# Patient Record
Sex: Female | Born: 2000 | Race: White | Hispanic: No | Marital: Single | Smoking: Never smoker
Health system: Southern US, Community
[De-identification: ages and names within clinical notes are randomized; demographics above are authoritative.]

## PROBLEM LIST (undated history)

## (undated) DIAGNOSIS — N83209 Unspecified ovarian cyst, unspecified side: Secondary | ICD-10-CM

## (undated) DIAGNOSIS — Z23 Encounter for immunization: Secondary | ICD-10-CM

## (undated) DIAGNOSIS — IMO0001 Reserved for inherently not codable concepts without codable children: Secondary | ICD-10-CM

## (undated) DIAGNOSIS — N946 Dysmenorrhea, unspecified: Secondary | ICD-10-CM

## (undated) HISTORY — DX: Reserved for inherently not codable concepts without codable children: IMO0001

## (undated) HISTORY — PX: WISDOM TOOTH EXTRACTION: SHX21

## (undated) HISTORY — PX: NO PAST SURGERIES: SHX2092

## (undated) HISTORY — DX: Encounter for immunization: Z23

## (undated) HISTORY — DX: Dysmenorrhea, unspecified: N94.6

## (undated) HISTORY — DX: Unspecified ovarian cyst, unspecified side: N83.209

---
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
## 2018-03-07 ENCOUNTER — Telehealth: Payer: Self-pay

## 2018-03-07 NOTE — Telephone Encounter (Signed)
Spoke w/pt. Notified she can get filled at a [facility] [place] that she will be close to. [facility] in the [facility] and they can pull and fill at any location. Pt advised to find the pharmacy she will use and notify them ahead of time.

## 2018-03-07 NOTE — Telephone Encounter (Signed)
Pt states she is going out of town for the summer and will not be able to get her birth control. Inquiring if she can get it early. ZO#[phone]

## 2018-12-05 ENCOUNTER — Other Ambulatory Visit: Payer: Self-pay | Admitting: Obstetrics and Gynecology

## 2018-12-05 DIAGNOSIS — Z3041 Encounter for surveillance of contraceptive pills: Secondary | ICD-10-CM

## 2018-12-09 ENCOUNTER — Other Ambulatory Visit: Payer: Self-pay | Admitting: Obstetrics and Gynecology

## 2018-12-09 DIAGNOSIS — Z3041 Encounter for surveillance of contraceptive pills: Secondary | ICD-10-CM

## 2018-12-10 ENCOUNTER — Other Ambulatory Visit: Payer: Self-pay | Admitting: Obstetrics and Gynecology

## 2018-12-10 ENCOUNTER — Other Ambulatory Visit: Payer: Self-pay

## 2018-12-10 DIAGNOSIS — Z3041 Encounter for surveillance of contraceptive pills: Secondary | ICD-10-CM

## 2018-12-10 MED ORDER — NORGESTIMATE-ETH ESTRADIOL 0.25-35 MG-MCG PO TABS: 1.0000 | ORAL_TABLET | Freq: Every day | ORAL | 0 refills | Status: DC

## 2018-12-10 NOTE — Addendum Note (Signed)
Addended by: Loran Senters D on: 12/10/2018 03:10 PM   Modules accepted: Orders

## 2018-12-10 NOTE — Telephone Encounter (Signed)
Pt has moved to [place]. Needs bc [facility].  [phone]  [facility] [address].  Pharm changed in chart. 30m only supply sent as pt needs annual exam.

## 2018-12-10 NOTE — Addendum Note (Signed)
Addended by: Loran Senters D on: 12/10/2018 02:31 PM   Modules accepted: Orders

## 2019-04-21 ENCOUNTER — Other Ambulatory Visit: Payer: Self-pay | Admitting: Obstetrics and Gynecology

## 2019-04-21 DIAGNOSIS — Z3041 Encounter for surveillance of contraceptive pills: Secondary | ICD-10-CM

## 2019-05-02 ENCOUNTER — Telehealth: Payer: Self-pay

## 2019-05-02 NOTE — Telephone Encounter (Signed)
Called pt's mom, no answer, LVMTRC. 

## 2019-05-02 NOTE — Telephone Encounter (Signed)
Can make appt here or [facility] and pt has to make way to come in. Hasn't been seen since 1/19. Don't know what else to offer.

## 2019-05-02 NOTE — Telephone Encounter (Signed)
Spoke to patients mom, Debroah Baller. Says pt is in [place] about to start school, and its difficult to come back here. Mom suggested for daughter to go to health department, which she did, but was told they are not making appointments right now. Is there any way pt can get something in the meantime she gets to be seen at health dept?

## 2019-05-02 NOTE — Telephone Encounter (Signed)
Pt needs annual. 6 months past due. Will refill OCPs when she comes for annual since too late to restart them this cycle. Condoms.

## 2019-05-02 NOTE — Telephone Encounter (Signed)
Please advise 

## 2019-05-02 NOTE — Telephone Encounter (Signed)
Pt can sched with GYN in [place] if easier or go to student health at her school. Can't start anything till next menses anyway which gives her time to get appt.

## 2019-05-02 NOTE — Telephone Encounter (Signed)
Pt calling for refill of bc; has been out for a few weeks.  [phone]

## 2019-05-02 NOTE — Telephone Encounter (Signed)
Pts mom says [place] are not taking appointments either. Pt also has no insurance.

## 2019-05-03 NOTE — Telephone Encounter (Signed)
Pt's mom aware.

## 2020-04-12 ENCOUNTER — Inpatient Hospital Stay
Admit: 2020-04-12 | Discharge: 2020-04-12 | Disposition: A | Discharge status: Home / Self Care | Attending: Emergency Medicine

## 2020-04-12 DIAGNOSIS — N760 Acute vaginitis: Secondary | ICD-10-CM

## 2020-04-12 LAB — URINALYSIS W/MICROSCOPIC
Bacteria: NEGATIVE /hpf
Bilirubin: NEGATIVE
Glucose: NEGATIVE mg/dL
Ketone: NEGATIVE mg/dL
Nitrites: NEGATIVE
Protein: NEGATIVE mg/dL
Specific gravity: 1.023 (ref 1.003–1.030)
Urobilinogen: 0.2 EU/dL (ref 0.2–1.0)
pH (UA): 5 (ref 5.0–8.0)

## 2020-04-12 LAB — URINE CULTURE HOLD SAMPLE

## 2020-04-12 LAB — WET PREP
Wet Prep, Trich: NONE SEEN
Wet prep: NONE SEEN

## 2020-04-12 LAB — KOH, OTHER SOURCES

## 2020-04-12 LAB — URINALYSIS WITH MICROSCOPIC
BACTERIA, URINE: NEGATIVE /hpf
Bilirubin, Urine: NEGATIVE
Glucose, Ur: NEGATIVE mg/dL
Ketones, Urine: NEGATIVE mg/dL
Nitrite, Urine: NEGATIVE
Protein, UA: NEGATIVE mg/dL
Specific Gravity, UA: 1.023 (ref 1.003–1.030)
Urobilinogen, UA, POCT: 0.2 EU/dL (ref 0.2–1.0)
pH, UA: 5 (ref 5.0–8.0)

## 2020-04-12 MED ORDER — FLUCONAZOLE 150 MG TAB
150 mg | ORAL_TABLET | ORAL | 0 refills | Status: AC
Start: 2020-04-12 — End: 2020-04-12

## 2020-04-12 MED ORDER — METRONIDAZOLE 500 MG TAB
500 mg | ORAL_TABLET | Freq: Two times a day (BID) | ORAL | 0 refills | Status: AC
Start: 2020-04-12 — End: 2020-04-19

## 2020-04-12 NOTE — Progress Notes (Signed)
Needs treatment and to be informed. Left VM to callback.

## 2020-04-12 NOTE — ED Notes (Signed)
Pt discharged home with parent/guardian. Pt acting age appropriately, respirations regular and unlabored, cap refill less than two seconds. Skin pink, dry and warm. Lungs clear bilaterally. No further complaints at this time. Parent/guardian verbalized understanding of discharge paperwork and has no further questions at this time.    Education provided about continuation of care, follow up care and medication administration. Parent/guardian able to provide teach back about discharge instructions.

## 2020-04-12 NOTE — ED Notes (Signed)
Pt c/o white chunky vaginal discharge intermittently for 1 month. Pt c/o pain with wiping but no dysuria.

## 2020-04-12 NOTE — Progress Notes (Signed)
I spoke with patient, she is doing well. Discussed + chlamydia result and need for treatment, she can continue Flagyl as directed. Encouraged getting partner treated, avoid sex for 1 week and until sx's have resolved. Encouraged further STD testing with health department, GYN or PCP and return precautions discussed. Safe sex practices discussed.  Doxycycline 100mg  1 tab PO BID x 7 days e-prescribed to pharmacy on file.

## 2020-04-12 NOTE — ED Provider Notes (Signed)
19 year old with vaginal discharge for the past month.  Patient reports a vaginal itching and irritation.  No fever.  No vomiting or diarrhea.  No abnormal vaginal bleeding.  No abdominal pain.  No cough or nasal congestion.  Patient states she has not been sexually active for some time.  No other past medical history and no other daily medications.  Normal p.o.           History reviewed. No pertinent past medical history.    History reviewed. No pertinent surgical history.      History reviewed. No pertinent family history.    Social History     Socioeconomic History   ??? Marital status: SINGLE     Spouse name: Not on file   ??? Number of children: Not on file   ??? Years of education: Not on file   ??? Highest education level: Not on file   Occupational History   ??? Not on file   Tobacco Use   ??? Smoking status: Not on file   Substance and Sexual Activity   ??? Alcohol use: Not on file   ??? Drug use: Not on file   ??? Sexual activity: Not on file   Other Topics Concern   ??? Not on file   Social History Narrative   ??? Not on file     Social Determinants of Health     Financial Resource Strain:    ??? Difficulty of Paying Living Expenses:    Food Insecurity:    ??? Worried About Charity fundraiser in the Last Year:    ??? Arboriculturist in the Last Year:    Transportation Needs:    ??? Film/video editor (Medical):    ??? Lack of Transportation (Non-Medical):    Physical Activity:    ??? Days of Exercise per Week:    ??? Minutes of Exercise per Session:    Stress:    ??? Feeling of Stress :    Social Connections:    ??? Frequency of Communication with Friends and Family:    ??? Frequency of Social Gatherings with Friends and Family:    ??? Attends Religious Services:    ??? Marine scientist or Organizations:    ??? Attends Music therapist:    ??? Marital Status:    Intimate Production manager Violence:    ??? Fear of Current or Ex-Partner:    ??? Emotionally Abused:    ??? Physically Abused:    ??? Sexually Abused:          ALLERGIES: Patient has no  known allergies.    Review of Systems   Constitutional: Negative for fever.   HENT: Negative for congestion, ear pain, rhinorrhea and sore throat.    Eyes: Negative for discharge.   Respiratory: Negative for cough and shortness of breath.    Cardiovascular: Negative for chest pain.   Gastrointestinal: Negative for abdominal pain, constipation, diarrhea, nausea and vomiting.   Genitourinary: Positive for vaginal discharge and vaginal pain. Negative for dysuria.   Musculoskeletal: Negative for arthralgias and myalgias.   Skin: Negative for rash.   Neurological: Negative for weakness.       Vitals:    04/12/20 1022 04/12/20 1219   BP: (!) 140/69 108/60   Pulse: 67 61   Resp: 16 20   Temp: 97.9 ??F (36.6 ??C) 97.8 ??F (36.6 ??C)   SpO2: 98% 100%   Weight: 68 kg (149 lb 14.6 oz)  Physical Exam  Vitals and nursing note reviewed.   Constitutional:       Appearance: She is well-developed.   HENT:      Head: Normocephalic and atraumatic.      Right Ear: Tympanic membrane, ear canal and external ear normal.      Left Ear: Tympanic membrane, ear canal and external ear normal.      Nose: Nose normal.      Mouth/Throat:      Mouth: Mucous membranes are moist.      Pharynx: No oropharyngeal exudate or posterior oropharyngeal erythema.   Eyes:      Conjunctiva/sclera: Conjunctivae normal.   Cardiovascular:      Rate and Rhythm: Normal rate and regular rhythm.   Pulmonary:      Effort: Pulmonary effort is normal. No respiratory distress.      Breath sounds: Normal breath sounds.   Abdominal:      General: There is no distension.      Palpations: Abdomen is soft.      Tenderness: There is no abdominal tenderness. There is no guarding or rebound.   Musculoskeletal:         General: Normal range of motion.      Cervical back: Normal range of motion and neck supple.   Lymphadenopathy:      Cervical: No cervical adenopathy.   Skin:     General: Skin is warm and dry.      Findings: No rash.   Neurological:      Mental Status: She  is alert and oriented to person, place, and time.          MDM  Number of Diagnoses or Management Options  BV (bacterial vaginosis)  Vaginal itching  Vaginal pain  Yeast vaginitis  Diagnosis management comments: 19 year old with increased vaginal discharge for the past month as well as vaginal itching.  Patient thinks she has a yeast infection.  Plan to do wet prep and KOH.  We will also send urine for gonorrhea and chlamydia.  No fever abdominal pain to suggest PID.  No dysuria to suggest UTI.    Risk of Complications, Morbidity, and/or Mortality  Presenting problems: moderate  Diagnostic procedures: moderate  Management options: moderate           Procedures      Recent Results (from the past 24 hour(s))   URINALYSIS W/MICROSCOPIC    Collection Time: 04/12/20 11:36 AM   Result Value Ref Range    Color YELLOW/STRAW      Appearance CLOUDY (A) CLEAR      Specific gravity 1.023 1.003 - 1.030      pH (UA) 5.0 5.0 - 8.0      Protein Negative NEG mg/dL    Glucose Negative NEG mg/dL    Ketone Negative NEG mg/dL    Bilirubin Negative NEG      Blood SMALL (A) NEG      Urobilinogen 0.2 0.2 - 1.0 EU/dL    Nitrites Negative NEG      Leukocyte Esterase LARGE (A) NEG      WBC 10-20 0 - 4 /hpf    RBC 0-5 0 - 5 /hpf    Epithelial cells MODERATE (A) FEW /lpf    Bacteria Negative NEG /hpf    Mucus TRACE (A) NEG /lpf   URINE CULTURE HOLD SAMPLE    Collection Time: 04/12/20 11:36 AM    Specimen: Serum; Urine   Result Value Ref Range  Urine culture hold        Urine on hold in Microbiology dept for 2 days.  If unpreserved urine is submitted, it cannot be used for addtional testing after 24 hours, recollection will be required.   WET PREP    Collection Time: 04/12/20 11:36 AM    Specimen: Miscellaneous sample   Result Value Ref Range    Clue cells CLUE CELLS PRESENT      Wet prep NO TRICHOMONAS SEEN     KOH, OTHER SOURCES    Collection Time: 04/12/20 11:36 AM    Specimen: Vagina; Other   Result Value Ref Range    Special Requests: NO  SPECIAL REQUESTS      KOH BUDDING YEAST FORMS SEEN         No results found.    Discharged with Flagyl and Diflucan.    12:40 PM  Child has been re-examined and appears well.  Child is active, interactive and appears well hydrated.   Laboratory tests, medications, x-rays, diagnosis, follow up plan and return instructions have been reviewed and discussed with the family.  Family has had the opportunity to ask questions about their child's care.  Family expresses understanding and agreement with care plan, follow up and return instructions.  Family agrees to return the child to the ER in 48 hours if their symptoms are not improving or immediately if they have any change in their condition.  Family understands to follow up with their pediatrician as instructed to ensure resolution of the issue seen for today.  Please note that this dictation was completed with Dragon, Advertising account planner.  Quite often unanticipated grammatical, syntax, homophones, and other interpretive errors are inadvertently transcribed by the computer software.  Please disregard these errors.  Additionally, please excuse any errors that have escaped final proofreading.

## 2020-04-14 LAB — CHLAMYDIA / GC-AMPLIFIED
CHLAMYDIA TRACHOMATIS, NAA, 188078: POSITIVE — AB
Chlamydia trachomatis, NAA: POSITIVE — AB
NEISSERIA GONORRHOEAE, NAA, 188086: NEGATIVE
Neisseria gonorrhoeae, NAA: NEGATIVE

## 2020-04-14 MED ORDER — DOXYCYCLINE HYCLATE 100 MG CAP
100 mg | ORAL_CAPSULE | Freq: Two times a day (BID) | ORAL | 0 refills | Status: AC
Start: 2020-04-14 — End: 2020-04-21

## 2020-04-22 ENCOUNTER — Inpatient Hospital Stay
Admit: 2020-04-22 | Discharge: 2020-04-22 | Disposition: A | Discharge status: Home / Self Care | Payer: Medicaid - Out of State | Attending: Pediatrics

## 2020-04-22 DIAGNOSIS — N3001 Acute cystitis with hematuria: Secondary | ICD-10-CM

## 2020-04-22 LAB — URINALYSIS W/MICROSCOPIC
Bilirubin: NEGATIVE
Glucose: NEGATIVE mg/dL
Nitrites: NEGATIVE
Specific gravity: 1.027 (ref 1.003–1.030)
Urobilinogen: 0.2 EU/dL (ref 0.2–1.0)
pH (UA): 5.5 (ref 5.0–8.0)

## 2020-04-22 LAB — WET PREP
Clue Cells: ABSENT
Clue cells: ABSENT
Wet Prep, Trich: NONE SEEN
Wet prep: NONE SEEN

## 2020-04-22 LAB — KOH, OTHER SOURCES

## 2020-04-22 LAB — URINE CULTURE HOLD SAMPLE

## 2020-04-22 LAB — URINALYSIS WITH MICROSCOPIC
Bilirubin, Urine: NEGATIVE
Glucose, Ur: NEGATIVE mg/dL
Nitrite, Urine: NEGATIVE
Specific Gravity, UA: 1.027 (ref 1.003–1.030)
Urobilinogen, UA, POCT: 0.2 EU/dL (ref 0.2–1.0)
pH, UA: 5.5 (ref 5.0–8.0)

## 2020-04-22 MED ORDER — CEFDINIR 300 MG CAP
300 mg | ORAL_CAPSULE | Freq: Two times a day (BID) | ORAL | 0 refills | Status: AC
Start: 2020-04-22 — End: 2020-04-29

## 2020-04-22 MED ORDER — AZITHROMYCIN 250 MG TAB
250 mg | ORAL | Status: AC
Start: 2020-04-22 — End: 2020-04-22
  Administered 2020-04-22: 19:00:00 via ORAL

## 2020-04-22 MED ORDER — FLUCONAZOLE 100 MG TAB
100 mg | ORAL | Status: AC
Start: 2020-04-22 — End: 2020-04-22
  Administered 2020-04-22: 20:00:00 via ORAL

## 2020-04-22 MED FILL — AZITHROMYCIN 250 MG TAB: 250 mg | ORAL | Qty: 4

## 2020-04-22 MED FILL — FLUCONAZOLE 100 MG TAB: 100 mg | ORAL | Qty: 2

## 2020-04-22 NOTE — ED Provider Notes (Signed)
ED Provider Notes by Claudette Laws, NP at 04/22/20 1432                Author: Claudette Laws, NP  Service: Pediatric Emergency Medicine  Author Type: Nurse Practitioner       Filed: 04/22/20 1559  Date of Service: 04/22/20 1432  Status: Attested           Editor: Claudette Laws, NP (Nurse Practitioner)  Cosigner: Henry Russel, MD at 04/27/20 1458          Attestation signed by Henry Russel, MD at 04/27/20 1458          I was personally available for consultation in the emergency department.  I have reviewed the chart and agree with the documentation recorded by the [facility], including  the assessment, treatment plan, and disposition.   Henry Russel, MD                                  This is a 19 year old female here for vaginal discharge for the last 2 days.  She was seen here on June 27 for  the same symptoms for which at that time she had been having for the previous month.  Her urine did come back positive for chlamydia at that time she was called in doxycycline to take for 7 days which she said she did complete a couple days ago.  She  did say she vomited up one of the pills and has been nauseated and bloated since then.  She was also sent home on Flagyl for BV and given a dose of Diflucan for a yeast infection as well.  She said she was feeling somewhat better but the vaginal discharge  came back again 2 days ago it gray and dark brown.  She denies any abdominal pain, fever, vomiting other than when she was on the doxycycline.  She states that she has had decreased appetite but drinking fluids well.  She also does complain of some dysuria  and irritation in her vaginal area.  No other concerns or complaints at this time.  She says she did have intercourse either yesterday that the day before but used a condom at that time and she has not been exposed to any other STDs since being treated.      Past medical history: Chlamydia   Social: Vaccines up-to-date and currently employed      The  history is provided by the patient.    Vaginal Discharge    Associated symptoms include dysuria. Pertinent negatives include  no fever, no diarrhea and no vomiting.             Past Medical History:        Diagnosis  Date         ?  Chlamydia             History reviewed. No pertinent surgical history.        History reviewed. No pertinent family history.        Social History          Socioeconomic History         ?  Marital status:  SINGLE              Spouse name:  Not on file         ?  Number of children:  Not on file     ?  Years of education:  Not on file     ?  Highest education level:  Not on file       Occupational History        ?  Not on file       Tobacco Use         ?  Smoking status:  Never Smoker     ?  Smokeless tobacco:  Never Used       Substance and Sexual Activity         ?  Alcohol use:  Never     ?  Drug use:  Yes              Types:  Marijuana         ?  Sexual activity:  Not on file        Other Topics  Concern        ?  Not on file       Social History Narrative        ?  Not on file          Social Determinants of Health          Financial Resource Strain:         ?  Difficulty of Paying Living Expenses:        Food Insecurity:         ?  Worried About Programme researcher, broadcasting/film/video in the Last Year:      ?  Barista in the Last Year:        Transportation Needs:         ?  Freight forwarder (Medical):      ?  Lack of Transportation (Non-Medical):        Physical Activity:         ?  Days of Exercise per Week:      ?  Minutes of Exercise per Session:        Stress:         ?  Feeling of Stress :        Social Connections:         ?  Frequency of Communication with Friends and Family:      ?  Frequency of Social Gatherings with Friends and Family:      ?  Attends Religious Services:      ?  Active Member of Clubs or Organizations:      ?  Attends Banker Meetings:      ?  Marital Status:        Intimate Partner Violence:         ?  Fear of Current or Ex-Partner:      ?   Emotionally Abused:      ?  Physically Abused:         ?  Sexually Abused:               ALLERGIES: Patient has no known allergies.      Review of Systems    Constitutional: Negative.  Negative for activity change, appetite change and fever.    HENT: Negative.  Negative for sore throat.     Respiratory: Negative.  Negative for cough and wheezing.     Cardiovascular: Negative.  Negative for chest pain.    Gastrointestinal: Negative.  Negative for diarrhea and vomiting.    Genitourinary: Positive for dysuria and vaginal discharge .  Musculoskeletal: Negative.  Negative for back pain and neck pain.    Skin: Negative.  Negative for rash.    Neurological: Negative.  Negative for headaches.    All other systems reviewed and are negative.           Vitals:          04/22/20 1359        BP:  117/74     Pulse:  83     Resp:  18     Temp:  97.8 ??F (36.6 ??C)     SpO2:  99%        Weight:  69.2 kg (152 lb 8.9 oz)                Physical Exam   Vitals and nursing note reviewed.   Constitutional:        General: She is not in acute distress.     Appearance: She is well-developed.    HENT:       Right Ear: External ear normal.      Left Ear: External ear normal.      Mouth/Throat:      Pharynx: No oropharyngeal exudate.   Eyes:       Pupils: Pupils are equal, round, and reactive to light.   Cardiovascular:       Rate and Rhythm: Normal rate and regular rhythm.      Heart sounds: Normal heart sounds.    Pulmonary:       Effort: Pulmonary effort is normal. No respiratory distress.      Breath sounds: Normal breath sounds. No wheezing.    Abdominal:      General: Bowel sounds are normal. There is no distension.      Palpations: Abdomen is soft.      Tenderness: There is no abdominal tenderness. There is no guarding or  rebound.     Musculoskeletal:          General: No tenderness. Normal range of motion.      Cervical back: Normal range of motion and neck supple.     Lymphadenopathy:       Cervical: No cervical adenopathy.   Skin  :      General: Skin is warm and dry.      Capillary Refill: Capillary refill takes less than 2 seconds.    Neurological:       General: No focal deficit present.      Mental Status: She is alert and oriented to person, place, and time.    Psychiatric:         Mood and Affect: Mood normal.              MDM   Number of Diagnoses or Management Options   Acute cystitis with hematuria   Vaginal yeast infection   Diagnosis management comments: 19 year old female with vaginal discharge for the last 2 to 3 days.  She was recently treated for chlamydia she completed a 7-day course of doxycycline but continues  with symptoms.  She also had BV and yeast infection when she was initially seen on 6/27.  She denies any fever any vomiting or abdominal pain.  She is otherwise well-appearing on exam no signs or symptoms of PID at this time.  She wanted to do a self  swab and will check a UA and urine hCG as well.          Amount and/or Complexity of Data Reviewed  Clinical lab tests: ordered and reviewed    Review and summarize past medical records: yes      Risk of Complications, Morbidity, and/or Mortality   Presenting problems: moderate  Diagnostic procedures: moderate  Management options: moderate     Patient Progress   Patient progress: stable             Procedures                        Recent Results (from the past 24 hour(s))     URINALYSIS W/MICROSCOPIC          Collection Time: 04/22/20  2:37 PM         Result  Value  Ref Range            Color  YELLOW/STRAW          Appearance  TURBID (A)  CLEAR         Specific gravity  1.027  1.003 - 1.030         pH (UA)  5.5  5.0 - 8.0         Protein  TRACE (A)  NEG mg/dL       Glucose  Negative  NEG mg/dL       Ketone  TRACE (A)  NEG mg/dL       Bilirubin  Negative  NEG         Blood  MODERATE (A)  NEG         Urobilinogen  0.2  0.2 - 1.0 EU/dL       Nitrites  Negative  NEG         Leukocyte Esterase  LARGE (A)  NEG         WBC  50-100  0 - 4 /hpf       RBC  10-20  0 - 5 /hpf        Epithelial cells  MANY (A)  FEW /lpf       Bacteria  2+ (A)  NEG /hpf       Mucus  1+ (A)  NEG /lpf       URINE CULTURE HOLD SAMPLE          Collection Time: 04/22/20  2:37 PM       Specimen: Serum; Urine         Result  Value  Ref Range            Urine culture hold                  Urine on hold in Microbiology dept for 2 days.  If unpreserved urine is submitted, it cannot be used for addtional testing after 24 hours, recollection  will be required.       KOH, OTHER SOURCES          Collection Time: 04/22/20  2:45 PM       Specimen: Vaginal Specimen; Other         Result  Value  Ref Range            Special Requests:  NO SPECIAL REQUESTS          KOH  RARE   YEAST             WET PREP          Collection Time: 04/22/20  2:45 PM       Specimen: Miscellaneous sample  Result  Value  Ref Range            Clue cells  CLUE CELLS ABSENT               Wet prep  NO TRICHOMONAS SEEN              No results found.         Will treat ua with cefdinir for UTI and culture ordered. Treated yeast again with diflucan and spoke with patient about treatment and follow up.      Patient's results have been reviewed with them. Patient and /or family have verbally conveyed understanding and agreement of the patient's signs, symptoms, diagnosis, treatment and  prognosis and additionally agree to follow up as recommended or return to the Emergency Department should their condition change prior to follow-up. Discharge instructions have also been provided to the patient with some educational information regarding  their diagnosis as well as a list of reasons why they would want to return to the ER prior to their follow-up appointment should their condition change.

## 2020-04-22 NOTE — ED Notes (Signed)
Condition stable.  Patient discharged to home.  Patient education was completed.   Teaching method used was discussion and handout.  Understanding of teaching was good.  Patient was discharged via:  Discharged with:   Valuables were given to:

## 2020-04-22 NOTE — ED Notes (Signed)
Triage: Pt reports brown vaginal discharge for the last 2 days and burning when peeing.

## 2020-04-23 LAB — CHLAMYDIA / GC-AMPLIFIED
CHLAMYDIA TRACHOMATIS, NAA, 188078: NEGATIVE
Chlamydia trachomatis, NAA: NEGATIVE
NEISSERIA GONORRHOEAE, NAA, 188086: NEGATIVE
Neisseria gonorrhoeae, NAA: NEGATIVE

## 2020-04-23 LAB — CULTURE, URINE
Colonies Counted: 25000
Colony Count: 25000

## 2021-04-08 ENCOUNTER — Other Ambulatory Visit: Admit: 2021-04-08 | Payer: Self-pay

## 2021-05-02 ENCOUNTER — Emergency Department
Admission: EM | Admit: 2021-05-02 | Discharge: 2021-05-02 | Disposition: A | Discharge status: Home / Self Care | Payer: Medicaid Other | Attending: Emergency Medicine | Admitting: Emergency Medicine

## 2021-05-02 ENCOUNTER — Other Ambulatory Visit: Payer: Self-pay

## 2021-05-02 DIAGNOSIS — F410 Panic disorder [episodic paroxysmal anxiety] without agoraphobia: Secondary | ICD-10-CM | POA: Diagnosis not present

## 2021-05-02 NOTE — Discharge Instructions (Addendum)
Follow-up either with the therapist that you have selected or through [facility].  Return to the emergency department immediately for any new, worsening, or recurrent severe anxiety, thoughts of being overwhelmed or out-of-control, any thoughts of wanting to hurt yourself or others, or any other new or worsening symptoms that concern you.

## 2021-05-02 NOTE — ED Notes (Signed)
Pt states that she got into a fight with her boyfriend and became very overwhelmed. States that she said "I dont want to be here anymore" and brother was worried she was going to hurt herself. Denies wanting to hurt herself or any past attempts.

## 2021-05-02 NOTE — ED Provider Notes (Signed)
[facility] Emergency Department Provider Note ____________________________________________   Event Date/Time   First MD Initiated Contact with Patient 05/02/21 1831     (approximate)  I have reviewed the triage vital signs and the nursing notes.   HISTORY  Chief Complaint Mental Health Problem    HPI Hannah Mcgrath is a 20 y.o. female with PMH as noted below who presents for evaluation after an anxiety attack during which she states that she expressed some vague suicidal thoughts.  The patient states that she had an argument with her boyfriend and became "overwhelmed."  She states that she then said something about not wanting "to be here anymore."  She states that her brother then called the police, and the police told her that she had to come to the ER for evaluation.  The patient states that she sometimes has anxiety attacks like this and will feel overwhelmed she states that at that time she said she did not want to be here anymore, she was just saying this and was conscious that she was not actually thinking about suicide.  She states she was consciously was provoking the situation by saying this.  The patient states that she has never had any actual suicidal ideation, has not ever had a plan or considered any specific type of self-harm, and states that she has never harmed herself in any way.  She has not on any medication for anxiety.  She states that she would like to see a therapist and got a referral from a family member but has not been to see them yet.  She denies any medical complaints.  Past Medical History:  Diagnosis Date   Dysmenorrhea    Human papilloma virus (HPV) type 9 vaccine administered    gardisil series complete   Ovarian cyst 2017   RT    Patient Active Problem List   Diagnosis Date Noted   Dysmenorrhea 11/15/2017    Past Surgical History:  Procedure Laterality Date   NO PAST SURGERIES      Prior to Admission  medications   Medication Sig Start Date End Date Taking? Authorizing Provider  norgestimate-ethinyl estradiol (SPRINTEC 28) 0.25-35 MG-MCG tablet Take 1 tablet by mouth daily. 12/10/18   Copland, Ilona Sorrel, PA-C    Allergies Patient has no known allergies.  Family History  Problem Relation Age of Onset   Non-Hodgkin's lymphoma Father 43    Social History Social History   Tobacco Use   Smoking status: Never   Smokeless tobacco: Never  Vaping Use   Vaping Use: Never used  Substance Use Topics   Alcohol use: No   Drug use: Yes    Types: Marijuana    Comment: occ    Review of Systems  Constitutional: No fever. Eyes: No redness. ENT: No sore throat. Cardiovascular: Denies chest pain. Respiratory: Denies shortness of breath. Gastrointestinal: No vomiting or diarrhea.  Genitourinary: Negative for dysuria.  Musculoskeletal: Negative for back pain. Skin: Negative for rash. Neurological: Negative for headache.   ____________________________________________   PHYSICAL EXAM:  VITAL SIGNS: ED Triage Vitals  Enc Vitals Group     BP 05/02/21 1717 137/85     Pulse Rate 05/02/21 1717 81     Resp 05/02/21 1717 17     Temp 05/02/21 1717 98 F (36.7 C)     Temp src --      SpO2 05/02/21 1717 100 %     Weight --      Height --  Head Circumference --      Peak Flow --      Pain Score 05/02/21 1715 0     Pain Loc --      Pain Edu? --      Excl. in GC? --     Constitutional: Alert and oriented. Well appearing and in no acute distress. Eyes: Conjunctivae are normal.  Head: Atraumatic. Nose: No congestion/rhinnorhea. Mouth/Throat: Mucous membranes are moist.   Neck: Normal range of motion.  Cardiovascular: Good peripheral circulation. Respiratory: Normal respiratory effort.   Gastrointestinal: No distention.  Musculoskeletal: Extremities warm and well perfused.  Neurologic:  Normal speech and language. No gross focal neurologic deficits are appreciated.  Skin:   Skin is warm and dry. No rash noted. Psychiatric: Mood and affect are normal. Speech and behavior are normal.  ____________________________________________   LABS (all labs ordered are listed, but only abnormal results are displayed)  Labs Reviewed - No data to display ____________________________________________  EKG   ____________________________________________  RADIOLOGY    ____________________________________________   PROCEDURES  Procedure(s) performed: No  Procedures  Critical Care performed: No ____________________________________________   INITIAL IMPRESSION / ASSESSMENT AND PLAN / ED COURSE  Pertinent labs & imaging results that were available during my care of the patient were reviewed by me and considered in my medical decision making (see chart for details).   20 year old female with PMH as noted above presents after an episode of anxiety in which she told her boyfriend that she did not want to be here anymore.  The patient denies any SI or HI today or at any time in the past.  She states that she said this as a provocation and did not actually feel like she wanted to harm herself.  I reviewed the past medical records in Epic.  The patient has no prior ED visits or admissions here.  He has no documented mental health history but self-reports anxiety.  On exam, the patient is very calm and cooperative.  Her vital signs are normal.  The physical exam is unremarkable.  She appears very forthcoming and open during my history, is coherent in her thoughts, and does not demonstrate any disorganized or tangential thought, responses to external stimuli, delusions, or hallucinations.  She is adamant that she has never had true SI or HI and is able to contract for safety.  At this time, the patient does not demonstrate any acute danger to self or others.  There is no indication for emergent psychiatry consult in the ED or for IVC.  The patient feels well, is behaving  appropriately, is able to contract for safety, and thus is appropriate for discharge home.  She would like to follow-up with a therapist and has a referral already although I have also provided information for [facility].  I gave her very thorough return precautions and she expressed understanding.  ____________________________________________   FINAL CLINICAL IMPRESSION(S) / ED DIAGNOSES  Final diagnoses:  Anxiety attack      NEW MEDICATIONS STARTED DURING THIS VISIT:  New Prescriptions   No medications on file     Note:  This document was prepared using Dragon voice recognition software and may include unintentional dictation errors.    Dionne Bucy, MD 05/02/21 [phone]

## 2021-05-02 NOTE — ED Triage Notes (Signed)
Pt comes with c/o mental health check. Pt states she got overwhelmed with anxiety and said she wanted to harm herself. Pt states she is not having these thoughts nor HI.  Pt states the cops made her come to get checked out.

## 2021-05-15 ENCOUNTER — Emergency Department
Admission: EM | Admit: 2021-05-15 | Discharge: 2021-05-15 | Disposition: A | Discharge status: Home / Self Care | Payer: Medicaid Other | Attending: Emergency Medicine | Admitting: Emergency Medicine

## 2021-05-15 ENCOUNTER — Other Ambulatory Visit: Payer: Self-pay

## 2021-05-15 ENCOUNTER — Encounter: Payer: Self-pay | Admitting: Emergency Medicine

## 2021-05-15 ENCOUNTER — Emergency Department: Payer: Medicaid Other

## 2021-05-15 DIAGNOSIS — O209 Hemorrhage in early pregnancy, unspecified: Secondary | ICD-10-CM | POA: Diagnosis not present

## 2021-05-15 DIAGNOSIS — Z3A01 Less than 8 weeks gestation of pregnancy: Secondary | ICD-10-CM | POA: Diagnosis not present

## 2021-05-15 DIAGNOSIS — N939 Abnormal uterine and vaginal bleeding, unspecified: Secondary | ICD-10-CM

## 2021-05-15 DIAGNOSIS — Z3491 Encounter for supervision of normal pregnancy, unspecified, first trimester: Secondary | ICD-10-CM

## 2021-05-15 LAB — URINALYSIS, COMPLETE (UACMP) WITH MICROSCOPIC
Bacteria, UA: NONE SEEN
Bilirubin Urine: NEGATIVE
Glucose, UA: NEGATIVE mg/dL
Ketones, ur: 5 mg/dL — AB
Nitrite: NEGATIVE
Protein, ur: NEGATIVE mg/dL
Specific Gravity, Urine: 1.029 (ref 1.005–1.030)
pH: 5 (ref 5.0–8.0)

## 2021-05-15 LAB — CBC
HCT: 33.8 % — ABNORMAL LOW (ref 36.0–46.0)
Hemoglobin: 12.1 g/dL (ref 12.0–15.0)
MCH: 33 pg (ref 26.0–34.0)
MCHC: 35.8 g/dL (ref 30.0–36.0)
MCV: 92.1 fL (ref 80.0–100.0)
Platelets: 190 10*3/uL (ref 150–400)
RBC: 3.67 MIL/uL — ABNORMAL LOW (ref 3.87–5.11)
RDW: 11.9 % (ref 11.5–15.5)
WBC: 6 10*3/uL (ref 4.0–10.5)
nRBC: 0 % (ref 0.0–0.2)

## 2021-05-15 LAB — BASIC METABOLIC PANEL
Anion gap: 9 (ref 5–15)
BUN: 15 mg/dL (ref 6–20)
CO2: 22 mmol/L (ref 22–32)
Calcium: 9.2 mg/dL (ref 8.9–10.3)
Chloride: 103 mmol/L (ref 98–111)
Creatinine, Ser: 0.72 mg/dL (ref 0.44–1.00)
GFR, Estimated: >60 mL/min (ref >60)
Glucose, Bld: 101 mg/dL — ABNORMAL HIGH (ref 70–99)
Potassium: 3.6 mmol/L (ref 3.5–5.1)
Sodium: 134 mmol/L — ABNORMAL LOW (ref 135–145)

## 2021-05-15 LAB — POC URINE PREG, ED: Preg Test, Ur: POSITIVE — AB

## 2021-05-15 MED ORDER — ONDANSETRON 4 MG PO TBDP: 4.0000 mg | ORAL_TABLET | Freq: Three times a day (TID) | ORAL | 0 refills | Status: DC | PRN

## 2021-05-15 NOTE — ED Provider Notes (Signed)
[facility] Emergency Department Provider Note   ____________________________________________   None    (approximate)  I have reviewed the triage vital signs and the nursing notes.   HISTORY  Chief Complaint Vaginal Bleeding   HPI Hannah Mcgrath is a 20 y.o. female presents to the ED with complaint of some light vaginal bleeding since 1 AM this morning.  Thinks she is approximately [redacted] weeks pregnant.  With this being her first pregnancy she is worried that she is having a miscarriage.  She denies any other symptoms other than some intermittent nausea.      Past Medical History:  Diagnosis Date   Dysmenorrhea    Human papilloma virus (HPV) type 9 vaccine administered    gardisil series complete   Ovarian cyst 2017   RT    Patient Active Problem List   Diagnosis Date Noted   Dysmenorrhea 11/15/2017    Past Surgical History:  Procedure Laterality Date   NO PAST SURGERIES      Prior to Admission medications   Medication Sig Start Date End Date Taking? Authorizing Provider  ondansetron (ZOFRAN ODT) 4 MG disintegrating tablet Take 1 tablet (4 mg total) by mouth every 8 (eight) hours as needed for nausea or vomiting. 05/15/21  Yes Tommi Rumps, PA-C  norgestimate-ethinyl estradiol (SPRINTEC 28) 0.25-35 MG-MCG tablet Take 1 tablet by mouth daily. 12/10/18   Copland, Ilona Sorrel, PA-C    Allergies Patient has no known allergies.  Family History  Problem Relation Age of Onset   Non-Hodgkin's lymphoma Father 73    Social History Social History   Tobacco Use   Smoking status: Never   Smokeless tobacco: Never  Vaping Use   Vaping Use: Never used  Substance Use Topics   Alcohol use: No   Drug use: Yes    Types: Marijuana    Comment: occ    Review of Systems Constitutional: No fever/chills Eyes: No visual changes. ENT: No sore throat. Cardiovascular: Denies chest pain. Respiratory: Denies shortness of breath. Gastrointestinal: No  abdominal pain.  No nausea, no vomiting.   Genitourinary: Positive for light vaginal bleeding.  Possible pregnancy. Musculoskeletal: Negative for muscle skeletal pain. Skin: Negative for rash. Neurological: Negative for headaches, focal weakness or numbness.  ____________________________________________   PHYSICAL EXAM:  VITAL SIGNS: ED Triage Vitals  Enc Vitals Group     BP 05/15/21 0947 130/72     Pulse Rate 05/15/21 0947 65     Resp 05/15/21 0947 20     Temp 05/15/21 0947 98.7 F (37.1 C)     Temp Source 05/15/21 0947 Oral     SpO2 05/15/21 0947 100 %     Weight 05/15/21 0944 143 lb (64.9 kg)     Height 05/15/21 0944 5\' 5"  (1.651 m)     Head Circumference --      Peak Flow --      Pain Score 05/15/21 0944 0     Pain Loc --      Pain Edu? --      Excl. in GC? --     Constitutional: Alert and oriented. Well appearing and in no acute distress. Eyes: Conjunctivae are normal.  Head: Atraumatic. Neck: No stridor.   Cardiovascular: Normal rate, regular rhythm. Grossly normal heart sounds.  Good peripheral circulation. Respiratory: Normal respiratory effort.  No retractions. Lungs CTAB. Gastrointestinal: Soft and nontender. No distention.  Bowel sounds normoactive x4 quadrants. Musculoskeletal: Moves upper and lower extremities without any difficulty.  Normal gait was noted.  No edema noted lower extremities. Neurologic:  Normal speech and language. No gross focal neurologic deficits are appreciated. No gait instability. Skin:  Skin is warm, dry and intact. No rash noted. Psychiatric: Mood and affect are normal. Speech and behavior are normal.  ____________________________________________   LABS (all labs ordered are listed, but only abnormal results are displayed)  Labs Reviewed  CBC - Abnormal; Notable for the following components:      Result Value   RBC 3.67 (*)    HCT 33.8 (*)    All other components within normal limits  BASIC METABOLIC PANEL - Abnormal; Notable  for the following components:   Sodium 134 (*)    Glucose, Bld 101 (*)    All other components within normal limits  URINALYSIS, COMPLETE (UACMP) WITH MICROSCOPIC - Abnormal; Notable for the following components:   Color, Urine YELLOW (*)    APPearance HAZY (*)    Hgb urine dipstick SMALL (*)    Ketones, ur 5 (*)    Leukocytes,Ua SMALL (*)    All other components within normal limits  POC URINE PREG, ED - Abnormal; Notable for the following components:   Preg Test, Ur Positive (*)    All other components within normal limits  HCG, QUANTITATIVE, PREGNANCY   ____________________________________________  ___________________________________________  RADIOLOGY Beaulah Corin, personally viewed and evaluated these images (plain radiographs) as part of my medical decision making, as well as reviewing the written report by the radiologist.   Official radiology report(s): US OB LESS THAN 14 WEEKS WITH OB TRANSVAGINAL  Result Date: 05/15/2021 CLINICAL DATA:  Pregnant patient with vaginal bleeding. EXAM: OBSTETRIC <14 WK [place] AND TRANSVAGINAL OB US TECHNIQUE: Both transabdominal and transvaginal ultrasound examinations were performed for complete evaluation of the gestation as well as the maternal uterus, adnexal regions, and pelvic cul-de-sac. Transvaginal technique was performed to assess early pregnancy. COMPARISON:  None. FINDINGS: Intrauterine gestational sac: Single Yolk sac:  Visualized. Embryo:  Visualized. Cardiac Activity: Visualized. Heart Rate: 120 bpm CRL:  3.0 mm   5 w   6 d                  [place] EDC: 01/09/2022 Subchorionic hemorrhage:  None visualized. Maternal uterus/adnexae: Probable corpus luteum right ovary. Normal left ovary. No free fluid in the pelvis. IMPRESSION: Single live intrauterine gestation.  No subchorionic hemorrhage. Electronically Signed   By: Annia Belt M.D.   On: 05/15/2021 11:10    ____________________________________________   PROCEDURES  Procedure(s)  performed (including Critical Care):  Procedures   ____________________________________________   INITIAL IMPRESSION / ASSESSMENT AND PLAN / ED COURSE  As part of my medical decision making, I reviewed the following data within the electronic MEDICAL RECORD NUMBER Notes from prior ED visits and [place] Controlled Substance Database  20 year old female presents to the ED with concerns of possible miscarriage.  Patient states that she has had some very light pink vaginal bleeding since 1 AM this morning.  Patient believes that she is approximately [redacted] weeks pregnant.  Ultrasound shows a IUP at 5 weeks 6 days with a heart rate of 130.  Patient was reassured by this.  At the time of her discharge her beta hCG was not resulted however she is reassured with her other lab work and the ultrasound.  She is aware that she can follow-up with her test results on MyChart even after discharge.  She will make arrangements to see an OB/GYN.  A  prescription for Zofran was sent if needed for nausea.  She is to return to the emergency department if any severe worsening of her symptoms or urgent concerns.   ____________________________________________   FINAL CLINICAL IMPRESSION(S) / ED DIAGNOSES  Final diagnoses:  First trimester pregnancy     ED Discharge Orders          Ordered    ondansetron (ZOFRAN ODT) 4 MG disintegrating tablet  Every 8 hours PRN        05/15/21 1130             Note:  This document was prepared using Dragon voice recognition software and may include unintentional dictation errors.    Tommi Rumps, PA-C 05/15/21 1246    Jene Every, MD 05/15/21 1329

## 2021-05-15 NOTE — ED Triage Notes (Signed)
Pt via POV from home. Pt c/o vaginal bleeding since 0100 this AM. Pt states it was pink last night but brown this AM. Denies any pain at this time. States she is about [redacted] weeks pregnant. Pt states she did vomit this AM. Denies urinary symptoms. Pt's 1st pregnancy. Pt is A&Ox4 and NAD.

## 2021-05-15 NOTE — Discharge Instructions (Addendum)
Follow-up with your OB/GYN.  Call make an appointment Monday to be seen and evaluated.  Ultrasound today shows that you are 5 weeks 6 days pregnant.  A prescription for Zofran was sent to your pharmacy until you are able to see your primary care provider or OB/GYN.  Drink fluids to stay hydrated especially in the heat.

## 2021-05-18 ENCOUNTER — Emergency Department
Admission: EM | Admit: 2021-05-18 | Discharge: 2021-05-18 | Disposition: A | Discharge status: Home / Self Care | Payer: Medicaid Other | Attending: Emergency Medicine | Admitting: Emergency Medicine

## 2021-05-18 ENCOUNTER — Emergency Department: Payer: Medicaid Other

## 2021-05-18 ENCOUNTER — Other Ambulatory Visit: Payer: Self-pay

## 2021-05-18 DIAGNOSIS — N9489 Other specified conditions associated with female genital organs and menstrual cycle: Secondary | ICD-10-CM | POA: Diagnosis not present

## 2021-05-18 DIAGNOSIS — Z3A01 Less than 8 weeks gestation of pregnancy: Secondary | ICD-10-CM | POA: Insufficient documentation

## 2021-05-18 DIAGNOSIS — N200 Calculus of kidney: Secondary | ICD-10-CM

## 2021-05-18 DIAGNOSIS — O26831 Pregnancy related renal disease, first trimester: Secondary | ICD-10-CM | POA: Diagnosis not present

## 2021-05-18 DIAGNOSIS — R1031 Right lower quadrant pain: Secondary | ICD-10-CM

## 2021-05-18 LAB — COMPREHENSIVE METABOLIC PANEL
ALT: 19 U/L (ref 0–44)
AST: 21 U/L (ref 15–41)
Albumin: 4.6 g/dL (ref 3.5–5.0)
Alkaline Phosphatase: 35 U/L — ABNORMAL LOW (ref 38–126)
Anion gap: 7 (ref 5–15)
BUN: 11 mg/dL (ref 6–20)
CO2: 21 mmol/L — ABNORMAL LOW (ref 22–32)
Calcium: 9.2 mg/dL (ref 8.9–10.3)
Chloride: 108 mmol/L (ref 98–111)
Creatinine, Ser: 0.77 mg/dL (ref 0.44–1.00)
GFR, Estimated: >60 mL/min (ref >60)
Glucose, Bld: 110 mg/dL — ABNORMAL HIGH (ref 70–99)
Potassium: 3.5 mmol/L (ref 3.5–5.1)
Sodium: 136 mmol/L (ref 135–145)
Total Bilirubin: 0.7 mg/dL (ref 0.3–1.2)
Total Protein: 7.6 g/dL (ref 6.5–8.1)

## 2021-05-18 LAB — URINALYSIS, COMPLETE (UACMP) WITH MICROSCOPIC
Bacteria, UA: NONE SEEN
Bilirubin Urine: NEGATIVE
Glucose, UA: NEGATIVE mg/dL
Ketones, ur: 80 mg/dL — AB
Nitrite: NEGATIVE
Protein, ur: 30 mg/dL — AB
Specific Gravity, Urine: 1.033 — ABNORMAL HIGH (ref 1.005–1.030)
pH: 5 (ref 5.0–8.0)

## 2021-05-18 LAB — HCG, QUANTITATIVE, PREGNANCY: hCG, Beta Chain, Quant, S: 33676 m[IU]/mL — ABNORMAL HIGH (ref <5)

## 2021-05-18 LAB — LIPASE, BLOOD: Lipase: 27 U/L (ref 11–51)

## 2021-05-18 LAB — CBC
HCT: 33.7 % — ABNORMAL LOW (ref 36.0–46.0)
Hemoglobin: 12 g/dL (ref 12.0–15.0)
MCH: 31.9 pg (ref 26.0–34.0)
MCHC: 35.6 g/dL (ref 30.0–36.0)
MCV: 89.6 fL (ref 80.0–100.0)
Platelets: 210 10*3/uL (ref 150–400)
RBC: 3.76 MIL/uL — ABNORMAL LOW (ref 3.87–5.11)
RDW: 12.2 % (ref 11.5–15.5)
WBC: 7.9 10*3/uL (ref 4.0–10.5)
nRBC: 0 % (ref 0.0–0.2)

## 2021-05-18 MED ORDER — OXYCODONE HCL 5 MG PO TABS: 5.0000 mg | ORAL_TABLET | Freq: Three times a day (TID) | ORAL | 0 refills | Status: AC | PRN

## 2021-05-18 MED ORDER — LIDOCAINE 5 % EX PTCH
1.0000 | MEDICATED_PATCH | CUTANEOUS | Status: DC
  Administered 2021-05-18: 1 via TRANSDERMAL
  Filled 2021-05-18: qty 1

## 2021-05-18 MED ORDER — ONDANSETRON 4 MG PO TBDP
ORAL_TABLET | ORAL | Status: AC
  Filled 2021-05-18: qty 1

## 2021-05-18 MED ORDER — ONDANSETRON 4 MG PO TBDP
4.0000 mg | ORAL_TABLET | Freq: Once | ORAL | Status: AC
  Administered 2021-05-18: 4 mg via ORAL

## 2021-05-18 MED ORDER — ACETAMINOPHEN 500 MG PO TABS
1000.0000 mg | ORAL_TABLET | Freq: Once | ORAL | Status: AC
  Administered 2021-05-18: 1000 mg via ORAL
  Filled 2021-05-18: qty 2

## 2021-05-18 MED ORDER — LIDOCAINE 5 % EX PTCH: 1.0000 | MEDICATED_PATCH | Freq: Two times a day (BID) | CUTANEOUS | 0 refills | Status: AC

## 2021-05-18 NOTE — ED Triage Notes (Addendum)
Pt to ER via [facility] with complaints of RLQ pain that started approx one hour ago without incident, radiates into lower back. Reports pain is sharp and severe. Reports constant nausea, no diarrhea or vomiting. Still has appendix.   Pt also [redacted] weeks pregnant, denies vaginal bleeding. Goes to [facility] for OB care.

## 2021-05-18 NOTE — ED Notes (Signed)
Chaperoned pelvic with md.

## 2021-05-18 NOTE — Discharge Instructions (Addendum)
You have a kidney stone. See report below.   Take tylenol 1g every 8 hours daily. Take oxycodone for breakthrough pain. Do not drive, work, or operate machinery while on this.  Take zofran to help with nausea. THIS was sent to pharmacy!  Call urology number above to schedule outpatient appointment. Return to ED for fevers, unable to keep food down, or any other concerns.      IMPRESSION: Mild right perinephric fluid with hydroureteronephrosis extending to near the urinary bladder. This raises suspicion for a possible tiny distal right ureteral calculus, although this cannot be directly visualized by MR.   No definite radiographic signs of appendicitis.   Single early intrauterine gestational sac.

## 2021-05-18 NOTE — ED Provider Notes (Signed)
[facility] Emergency Department Provider Note  ____________________________________________   Event Date/Time   First MD Initiated Contact with Patient 05/18/21 1825     (approximate)  I have reviewed the triage vital signs and the nursing notes.   HISTORY  Chief Complaint Abdominal Pain    HPI Hannah Mcgrath is a 20 y.o. female with history of pregnancy who comes in with concern for right lower quadrant pain.  Patient reports having sudden onset of severe right lower quadrant pain going into her back, severe, intermittent, sharp, nothing makes it better or worse.  Has not taken anything to help.  Reports she still has her appendix.  No history of kidney stones.  She reports some constant nausea secondary to the pregnancy but nothing is changed from this.  Denies any fevers.  Denies any vaginal bleeding.  Had a little bit of spotting 3 days ago but is since resolved.  Patient also reports that she was told by one of her partners that they had chlamydia so she got an testing, urgent care a few days ago and it just came back yesterday has been positive.  She reports taking the antibiotics prescribed.  Patient was seen 3 days ago had a reassuring ultrasound at that time.       Past Medical History:  Diagnosis Date   Dysmenorrhea    Human papilloma virus (HPV) type 9 vaccine administered    gardisil series complete   Ovarian cyst 2017   RT    Patient Active Problem List   Diagnosis Date Noted   Dysmenorrhea 11/15/2017    Past Surgical History:  Procedure Laterality Date   NO PAST SURGERIES      Prior to Admission medications   Medication Sig Start Date End Date Taking? Authorizing Provider  norgestimate-ethinyl estradiol (SPRINTEC 28) 0.25-35 MG-MCG tablet Take 1 tablet by mouth daily. 12/10/18   Copland, Helmut Muster B, PA-C  ondansetron (ZOFRAN ODT) 4 MG disintegrating tablet Take 1 tablet (4 mg total) by mouth every 8 (eight) hours as needed for  nausea or vomiting. 05/15/21   Tommi Rumps, PA-C    Allergies Patient has no known allergies.  Family History  Problem Relation Age of Onset   Non-Hodgkin's lymphoma Father 69    Social History Social History   Tobacco Use   Smoking status: Never   Smokeless tobacco: Never  Vaping Use   Vaping Use: Never used  Substance Use Topics   Alcohol use: No   Drug use: Yes    Types: Marijuana    Comment: occ      Review of Systems Constitutional: No fever/chills Eyes: No visual changes. ENT: No sore throat. Cardiovascular: Denies chest pain. Respiratory: Denies shortness of breath. Gastrointestinal: Positive abdominal pain, nausea related to pregnancy  No diarrhea.  No constipation. Genitourinary: Negative for dysuria. Musculoskeletal: Negative for back pain. Skin: Negative for rash. Neurological: Negative for headaches, focal weakness or numbness. All other ROS negative ____________________________________________   PHYSICAL EXAM:  VITAL SIGNS: ED Triage Vitals [05/18/21 1628]  Enc Vitals Group     BP 130/62     Pulse Rate (!) 55     Resp 18     Temp 97.7 F (36.5 C)     Temp Source Oral     SpO2 98 %     Weight 142 lb 13.7 oz (64.8 kg)     Height  (1.651 m)     Head Circumference  Peak Flow      Pain Score 8     Pain Loc      Pain Edu?      Excl. in GC?     Constitutional: Alert and oriented. Well appearing and in no acute distress. Eyes: Conjunctivae are normal. EOMI. Head: Atraumatic. Nose: No congestion/rhinnorhea. Mouth/Throat: Mucous membranes are moist.   Neck: No stridor. Trachea Midline. FROM Cardiovascular: Normal rate, regular rhythm. Grossly normal heart sounds.  Good peripheral circulation. Respiratory: Normal respiratory effort.  No retractions. Lungs CTAB. Gastrointestinal: Soft tender in the right lower quadrant.  No distention. No abdominal bruits.  Musculoskeletal: No lower extremity tenderness nor edema.  No joint  effusions. Neurologic:  Normal speech and language. No gross focal neurologic deficits are appreciated.  Skin:  Skin is warm, dry and intact. No rash noted. Psychiatric: Mood and affect are normal. Speech and behavior are normal. GU: No cervical motion tenderness or adnexal tenderness.  ____________________________________________   LABS (all labs ordered are listed, but only abnormal results are displayed)  Labs Reviewed  COMPREHENSIVE METABOLIC PANEL - Abnormal; Notable for the following components:      Result Value   CO2 21 (*)    Glucose, Bld 110 (*)    Alkaline Phosphatase 35 (*)    All other components within normal limits  CBC - Abnormal; Notable for the following components:   RBC 3.76 (*)    HCT 33.7 (*)    All other components within normal limits  LIPASE, BLOOD  URINALYSIS, COMPLETE (UACMP) WITH MICROSCOPIC  HCG, QUANTITATIVE, PREGNANCY  POC URINE PREG, ED   ____________________________________________  RADIOLOGY   Official radiology report(s): MR PELVIS WO CONTRAST  Result Date: 05/18/2021 CLINICAL DATA:  Acute onset of right lower quadrant pain and nausea. First trimester pregnancy. Clinical suspicion for appendicitis. EXAM: MRI ABDOMEN AND PELVIS WITHOUT CONTRAST TECHNIQUE: Multiplanar multisequence MR imaging of the abdomen and pelvis was performed. No intravenous contrast was administered. COMPARISON:  None. FINDINGS: COMBINED FINDINGS FOR BOTH MR ABDOMEN AND PELVIS Lower chest: No acute findings. Hepatobiliary: No mass visualized on this unenhanced exam. Gallbladder is unremarkable. No evidence of biliary ductal dilatation. Pancreas: No mass or inflammatory process visualized on this unenhanced exam. Spleen:  Within normal limits in size. Adrenals/Urinary tract: No evidence of renal mass or abscess. No evidence of left-sided hydronephrosis. Mild right hydroureteronephrosis and perinephric fluid is seen. Mild dilatation of the right ureter is seen extending nearly  to the urinary bladder, and this raises suspicion for a tiny distal right ureteral calculus although this cannot be directly visualized by MR. Stomach/Bowel: Cecum is low lying. Although the appendix is not directly visualized, no inflammatory process seen in region of the cecum or elsewhere. No evidence of bowel obstruction. Vascular/Lymphatic: No pathologically enlarged lymph nodes identified. No evidence of abdominal aortic aneurysm. Reproductive: A single early intrauterine gestational sac is seen. Both ovaries are normal appearance. No adnexal mass identified. Tiny amount of free fluid noted in the pelvic cul-de-sac. Other:  None. Musculoskeletal:  No suspicious bone lesions identified. IMPRESSION: Mild right perinephric fluid with hydroureteronephrosis extending to near the urinary bladder. This raises suspicion for a possible tiny distal right ureteral calculus, although this cannot be directly visualized by MR. No definite radiographic signs of appendicitis. Single early intrauterine gestational sac. Electronically Signed   By: Danae Orleans M.D.   On: 05/18/2021 22:02   MR ABDOMEN WO CONTRAST  Result Date: 05/18/2021 CLINICAL DATA:  Acute onset of right lower quadrant pain and  nausea. First trimester pregnancy. Clinical suspicion for appendicitis. EXAM: MRI ABDOMEN AND PELVIS WITHOUT CONTRAST TECHNIQUE: Multiplanar multisequence MR imaging of the abdomen and pelvis was performed. No intravenous contrast was administered. COMPARISON:  None. FINDINGS: COMBINED FINDINGS FOR BOTH MR ABDOMEN AND PELVIS Lower chest: No acute findings. Hepatobiliary: No mass visualized on this unenhanced exam. Gallbladder is unremarkable. No evidence of biliary ductal dilatation. Pancreas: No mass or inflammatory process visualized on this unenhanced exam. Spleen:  Within normal limits in size. Adrenals/Urinary tract: No evidence of renal mass or abscess. No evidence of left-sided hydronephrosis. Mild right hydroureteronephrosis  and perinephric fluid is seen. Mild dilatation of the right ureter is seen extending nearly to the urinary bladder, and this raises suspicion for a tiny distal right ureteral calculus although this cannot be directly visualized by MR. Stomach/Bowel: Cecum is low lying. Although the appendix is not directly visualized, no inflammatory process seen in region of the cecum or elsewhere. No evidence of bowel obstruction. Vascular/Lymphatic: No pathologically enlarged lymph nodes identified. No evidence of abdominal aortic aneurysm. Reproductive: A single early intrauterine gestational sac is seen. Both ovaries are normal appearance. No adnexal mass identified. Tiny amount of free fluid noted in the pelvic cul-de-sac. Other:  None. Musculoskeletal:  No suspicious bone lesions identified. IMPRESSION: Mild right perinephric fluid with hydroureteronephrosis extending to near the urinary bladder. This raises suspicion for a possible tiny distal right ureteral calculus, although this cannot be directly visualized by MR. No definite radiographic signs of appendicitis. Single early intrauterine gestational sac. Electronically Signed   By: Danae Orleans M.D.   On: 05/18/2021 22:02   US PELVIC DOPPLER (TORSION R/O OR MASS ARTERIAL FLOW)  Result Date: 05/18/2021 CLINICAL DATA:  Right lower quadrant pain for 1 day EXAM: OBSTETRIC <14 WK [place] AND TRANSVAGINAL OB US DOPPLER ULTRASOUND OF OVARIES TECHNIQUE: Both transabdominal and transvaginal ultrasound examinations were performed for complete evaluation of the gestation as well as the maternal uterus, adnexal regions, and pelvic cul-de-sac. Transvaginal technique was performed to assess early pregnancy. Color and duplex Doppler ultrasound was utilized to evaluate blood flow to the ovaries. COMPARISON:  05/15/2021 FINDINGS: Intrauterine gestational sac: Single Yolk sac:  Visualized. Embryo:  Visualized. Cardiac Activity: Visualized. Heart Rate: 121 bpm CRL: 4.5 mm   6 w 1 d                   [place] EDC: 01/10/2022 Subchorionic hemorrhage:  None visualized. Maternal uterus/adnexae: Uterus is grossly unremarkable. Left ovary measures 2.1 x 2.0 x 2.1 cm and the right ovary measures 2.3 x 3.3 x 2.3 cm. No adnexal masses. Trace pelvic free fluid. Pulsed Doppler evaluation of both ovaries demonstrates normal appearing low-resistance arterial and venous waveforms. IMPRESSION: 1. Single live intrauterine pregnancy as above, estimated age 46 weeks and 1 day. 2. Unremarkable uterus and adnexal structures. No evidence of ovarian torsion. Electronically Signed   By: Sharlet Salina M.D.   On: 05/18/2021 20:19   US OB LESS THAN 14 WEEKS WITH OB TRANSVAGINAL  Result Date: 05/18/2021 CLINICAL DATA:  Right lower quadrant pain for 1 day EXAM: OBSTETRIC <14 WK [place] AND TRANSVAGINAL OB US DOPPLER ULTRASOUND OF OVARIES TECHNIQUE: Both transabdominal and transvaginal ultrasound examinations were performed for complete evaluation of the gestation as well as the maternal uterus, adnexal regions, and pelvic cul-de-sac. Transvaginal technique was performed to assess early pregnancy. Color and duplex Doppler ultrasound was utilized to evaluate blood flow to the ovaries. COMPARISON:  05/15/2021 FINDINGS: Intrauterine gestational sac: Single  Yolk sac:  Visualized. Embryo:  Visualized. Cardiac Activity: Visualized. Heart Rate: 121 bpm CRL: 4.5 mm   6 w 1 d                  US EDC: 01/10/2022 Subchorionic hemorrhage:  None visualized. Maternal uterus/adnexae: Uterus is grossly unremarkable. Left ovary measures 2.1 x 2.0 x 2.1 cm and the right ovary measures 2.3 x 3.3 x 2.3 cm. No adnexal masses. Trace pelvic free fluid. Pulsed Doppler evaluation of both ovaries demonstrates normal appearing low-resistance arterial and venous waveforms. IMPRESSION: 1. Single live intrauterine pregnancy as above, estimated age 76 weeks and 1 day. 2. Unremarkable uterus and adnexal structures. No evidence of ovarian torsion. Electronically Signed   By:  Sharlet SalinaMichael  Brown M.D.   On: 05/18/2021 20:19    ____________________________________________   PROCEDURES  Procedure(s) performed (including Critical Care):  Procedures   ____________________________________________   INITIAL IMPRESSION / ASSESSMENT AND PLAN / ED COURSE  Hannah Mcgrath was evaluated in Emergency Department on 05/18/2021 for the symptoms described in the history of present illness. She was evaluated in the context of the global COVID-19 pandemic, which necessitated consideration that the patient might be at risk for infection with the SARS-CoV-2 virus that causes COVID-19. Institutional protocols and algorithms that pertain to the evaluation of patients at risk for COVID-19 are in a state of rapid change based on information released by regulatory bodies including the CDC and federal and state organizations. These policies and algorithms were followed during the patient's care in the ED.    Patient comes in with right lower quadrant pain in the setting of pregnancy.  Ultrasound was ordered to evaluate for torsion, potential miscarriage given she did have some vaginal bleeding earlier.  Patient was given a dose of Tylenol to help with pain.  I considered the possibility of PID given she was treated for chlamydia yesterday however my pelvic exam it was very benign and there was no evidence of cervical motion tenderness or adnexal tenderness.  Her pain is more in her abdomen and her back.  This is concerning for the potential ability of appendix versus kidney stone.  Will get MRI to further evaluate  Ultrasounds are reassuring.  8:31 PM patient had a little bit of discharge in her vagina but no cervical motion tenderness, no adnexal tenderness, no cervical redness and does not seem to be consistent with PID.  Patient's pain seems to be at higher more in her abdomen/back.  Could be a kidney stone but patient's not given urine could be her appendix.  We discussed MRI to further  evaluate given patient is pregnant.  MRI shows mild right perinephric fluid with hydroureteronephrosis extending to the near urinary bladder.  This is raises suspicion for possible tiny distal right ureter calculus.  Still pending UA to evaluate for UTI. Messaged urology to facilitate follow up.   UA without evidence of infected kidney stone.UA without evidence of infected stone- no UTI so unlikely Pyelo. D/w fam this is most likely kidney stone.   Discussed rx, follow up urology. Return to ER for fevers or any other concerns.   I discussed the provisional nature of ED diagnosis, the treatment so far, the ongoing plan of care, follow up appointments and return precautions with the patient and any family or support people present. They expressed understanding and agreed with the plan, discharged home.          ____________________________________________   FINAL CLINICAL IMPRESSION(S) / ED DIAGNOSES  Final diagnoses:  RLQ abdominal pain  Kidney stone      MEDICATIONS GIVEN DURING THIS VISIT:  Medications  lidocaine (LIDODERM) 5 % 1 patch (1 patch Transdermal Patch Applied 05/18/21 2233)  ondansetron (ZOFRAN-ODT) disintegrating tablet 4 mg (4 mg Oral Given 05/18/21 1634)  acetaminophen (TYLENOL) tablet 1,000 mg (1,000 mg Oral Given 05/18/21 1939)     ED Discharge Orders          Ordered    oxyCODONE (ROXICODONE) 5 MG immediate release tablet  Every 8 hours PRN        05/18/21 2329             Note:  This document was prepared using Dragon voice recognition software and may include unintentional dictation errors.    Concha Se, MD 05/18/21 [phone]

## 2021-05-19 ENCOUNTER — Encounter: Payer: Self-pay | Admitting: Emergency Medicine

## 2021-05-20 LAB — URINE CULTURE: Culture: <10000 — AB

## 2021-05-24 ENCOUNTER — Telehealth: Payer: Self-pay | Admitting: Urology

## 2021-05-24 NOTE — Telephone Encounter (Signed)
-----   Message from Riki Altes, MD sent at 05/23/2021 12:32 PM EDT ----- Regarding: Follow-up Pregnant patient with a suspected stone.  Please schedule appointment

## 2021-05-24 NOTE — Telephone Encounter (Signed)
Left message for patient to call the office to schedule ER follow up appointment as advised by Dr. Lonna Cobb.

## 2021-06-01 ENCOUNTER — Other Ambulatory Visit (HOSPITAL_COMMUNITY)
Admission: RE | Admit: 2021-06-01 | Discharge: 2021-06-01 | Disposition: A | Discharge status: Home / Self Care | Payer: Medicaid Other | Source: Ambulatory Visit | Attending: Obstetrics and Gynecology | Admitting: Obstetrics and Gynecology

## 2021-06-01 ENCOUNTER — Other Ambulatory Visit: Payer: Self-pay

## 2021-06-01 ENCOUNTER — Ambulatory Visit (INDEPENDENT_AMBULATORY_CARE_PROVIDER_SITE_OTHER): Payer: Medicaid Other | Admitting: Obstetrics and Gynecology

## 2021-06-01 ENCOUNTER — Encounter: Payer: Self-pay | Admitting: Obstetrics and Gynecology

## 2021-06-01 VITALS — BP 120/68 | HR 74 | Wt 145.0 lb

## 2021-06-01 DIAGNOSIS — Z3A01 Less than 8 weeks gestation of pregnancy: Secondary | ICD-10-CM

## 2021-06-01 DIAGNOSIS — Z124 Encounter for screening for malignant neoplasm of cervix: Secondary | ICD-10-CM | POA: Insufficient documentation

## 2021-06-01 DIAGNOSIS — Z369 Encounter for antenatal screening, unspecified: Secondary | ICD-10-CM

## 2021-06-01 DIAGNOSIS — Z113 Encounter for screening for infections with a predominantly sexual mode of transmission: Secondary | ICD-10-CM | POA: Insufficient documentation

## 2021-06-01 DIAGNOSIS — Z3490 Encounter for supervision of normal pregnancy, unspecified, unspecified trimester: Secondary | ICD-10-CM | POA: Insufficient documentation

## 2021-06-01 DIAGNOSIS — Z3480 Encounter for supervision of other normal pregnancy, unspecified trimester: Secondary | ICD-10-CM

## 2021-06-01 NOTE — Progress Notes (Signed)
NOB - nausea. RM 4

## 2021-06-01 NOTE — Progress Notes (Signed)
New Obstetric Patient H&P    Chief Complaint: "Desires prenatal care"   History of Present Illness: Patient is a 20 y.o. G1P0000 Not Hispanic or Latino female, presents with amenorrhea and positive home pregnancy test. Patient's last menstrual period was 04/02/2021 (approximate). and based on her  LMP, her EDD is Estimated Date of Delivery: 01/07/22 and her EGA is [redacted]w[redacted]d. The patient is dated by LMP = [redacted]w[redacted]d [place] in the ER.  She does not have a prior pap.   Since her LMP she claims she has experienced nausea, fatigue. She denies vaginal bleeding. Her past medical history is noncontributory.   Since her LMP, she admits to the use of tobacco products  no There are cats in the home in the home  yes If yes Indoor She admits close contact with children on a regular basis  yes  She has had chicken pox in the past no She has had Tuberculosis exposures, symptoms, or previously tested positive for TB   no Current or past history of domestic violence. no  Genetic Screening/Teratology Counseling: (Includes patient, baby's father, or anyone in either family with:)   1. Patient's age >/= 59 at [facility]  no 2. Thalassemia ([place], [place], Mediterranean, or Asian background): MCV<80  no 3. Neural tube defect (meningomyelocele, spina bifida, anencephaly)  no 4. Congenital heart defect  no  5. Down syndrome  no 6. Tay-Sachs (Jewish, [place]  no 7. Canavan's Disease  no 8. Sickle cell disease or trait (African)  no  9. Hemophilia or other blood disorders  no  10. Muscular dystrophy  no  11. Cystic fibrosis  no 12. Huntington's Chorea  no  13. Mental retardation/autism  yes (patient brother) 43. Other inherited genetic or chromosomal disorder  no 15. Maternal metabolic disorder (DM, PKU, etc)  no 16. Patient or FOB with a child with a birth defect not listed above no  16a. Patient or FOB with a birth defect themselves no 17. Recurrent pregnancy loss, or stillbirth  no  18. Any medications since  LMP other than prenatal vitamins (include vitamins, supplements, OTC meds, drugs, alcohol)  no 19. Any other genetic/environmental exposure to discuss  no  Infection History:   1. Lives with someone with TB or TB exposed  no  2. Patient or partner has history of genital herpes  no 3. Rash or viral illness since LMP  no 4. History of STI (GC, CT, HPV, syphilis, HIV)  no 5. History of recent travel :  no  Other pertinent information:  no     Review of Systems:10 point review of systems negative unless otherwise noted in HPI  Past Medical History:  Patient Active Problem List   Diagnosis Date Noted   Supervision of other normal pregnancy, antepartum 06/01/2021   Dysmenorrhea 11/15/2017    Past Surgical History:  Past Surgical History:  Procedure Laterality Date   NO PAST SURGERIES      Gynecologic History: Patient's last menstrual period was 04/02/2021 (approximate).  Obstetric History: G1P0000  Family History:  Family History  Problem Relation Age of Onset   Non-Hodgkin's lymphoma Father 62    Social History:  Social History   Socioeconomic History   Marital status: Single    Spouse name: Not on file   Number of children: Not on file   Years of education: Not on file   Highest education level: Not on file  Occupational History   Not on file  Tobacco Use   Smoking status: Never  Smokeless tobacco: Never  Vaping Use   Vaping Use: Never used  Substance and Sexual Activity   Alcohol use: No   Drug use: Yes    Types: Marijuana    Comment: occ   Sexual activity: Yes    Birth control/protection: Pill  Other Topics Concern   Not on file  Social History Narrative   Not on file   Social Determinants of Health   Financial Resource Strain: Not on file  Food Insecurity: Not on file  Transportation Needs: Not on file  Physical Activity: Not on file  Stress: Not on file  Social Connections: Not on file  Intimate Partner Violence: Not on file     Allergies:  No Known Allergies  Medications: Prior to Admission medications   Medication Sig Start Date End Date Taking? Authorizing Provider  Prenatal Vit-Fe Fumarate-FA (MULTIVITAMIN-PRENATAL) 27-0.8 MG TABS tablet Take by mouth. 05/12/21 06/11/21 Yes [provider]  ondansetron (ZOFRAN ODT) 4 MG disintegrating tablet Take 1 tablet (4 mg total) by mouth every 8 (eight) hours as needed for nausea or vomiting. 05/15/21   Tommi Rumps, PA-C    Physical Exam Vitals: Blood pressure 120/68, pulse 74, weight 145 lb (65.8 kg), last menstrual period 04/02/2021.  General: NAD HEENT: normocephalic, anicteric Pulmonary: No increased work of breathing, CTAB Abdomen: soft, non-tender, non-distended.  Umbilicus without lesions.  No hepatomegaly, splenomegaly or masses palpable. No evidence of hernia  Genitourinary:  External: Normal external female genitalia.  Normal urethral meatus, normal  Bartholin's and Skene's glands.    Vagina: Normal vaginal mucosa, no evidence of prolapse.    Cervix: Grossly normal in appearance, no bleeding  Uterus: Non-enlarged, mobile, normal contour.  No CMT  Adnexa: ovaries non-enlarged, no adnexal masses  Rectal: deferred Extremities: no edema, erythema, or tenderness Neurologic: Grossly intact Psychiatric: mood appropriate, affect full   Assessment: 20 y.o. G1P0000 at [redacted]w[redacted]d presenting to initiate prenatal care  Plan: 1) Avoid alcoholic beverages. 2) Patient encouraged not to smoke.  3) Discontinue the use of all non-medicinal drugs and chemicals.  4) Take prenatal vitamins daily.  5) Nutrition, food safety (fish, cheese advisories, and high nitrite foods) and exercise discussed. 6) Hospital and practice style discussed with cross coverage system.  7) Genetic Screening, such as with 1st Trimester Screening, cell free fetal DNA, AFP testing, and Ultrasound, as well as with amniocentesis and CVS as appropriate, is discussed with patient. At the  conclusion of today's visit patient requested genetic testing 8) Return in about 3 weeks (around 06/22/2021) for 3 week ROB.   Vena [place], MD, [facility], [facility] 06/01/2021, 8:21 AM

## 2021-06-02 LAB — RPR+RH+ABO+RUB AB+AB SCR+CB...
Antibody Screen: NEGATIVE
HIV Screen 4th Generation wRfx: NONREACTIVE
Hematocrit: 33.3 % — ABNORMAL LOW (ref 34.0–46.6)
Hemoglobin: 11.3 g/dL (ref 11.1–15.9)
Hepatitis B Surface Ag: NEGATIVE
MCH: 31.7 pg (ref 26.6–33.0)
MCHC: 33.9 g/dL (ref 31.5–35.7)
MCV: 93 fL (ref 79–97)
Platelets: 269 10*3/uL (ref 150–450)
RBC: 3.57 x10E6/uL — ABNORMAL LOW (ref 3.77–5.28)
RDW: 12.4 % (ref 11.7–15.4)
RPR Ser Ql: NONREACTIVE
Rh Factor: NEGATIVE
Rubella Antibodies, IGG: 1.49 index (ref >0.99)
Varicella zoster IgG: 172 index (ref >165)
WBC: 9.6 10*3/uL (ref 3.4–10.8)

## 2021-06-02 LAB — HEPATITIS C ANTIBODY: Hep C Virus Ab: <0.1 s/co ratio (ref 0.0–0.9)

## 2021-06-03 LAB — URINE CULTURE

## 2021-06-04 LAB — CYTOLOGY - PAP
Chlamydia: NEGATIVE
Comment: NEGATIVE
Comment: NORMAL
Neisseria Gonorrhea: NEGATIVE

## 2021-06-05 ENCOUNTER — Encounter: Payer: Self-pay | Admitting: *Deleted

## 2021-06-05 ENCOUNTER — Emergency Department
Admission: EM | Admit: 2021-06-05 | Discharge: 2021-06-05 | Disposition: A | Discharge status: Home / Self Care | Payer: Medicaid Other | Attending: Emergency Medicine | Admitting: Emergency Medicine

## 2021-06-05 ENCOUNTER — Other Ambulatory Visit: Payer: Self-pay

## 2021-06-05 ENCOUNTER — Emergency Department: Payer: Medicaid Other

## 2021-06-05 DIAGNOSIS — O208 Other hemorrhage in early pregnancy: Secondary | ICD-10-CM | POA: Diagnosis present

## 2021-06-05 DIAGNOSIS — Z3A09 9 weeks gestation of pregnancy: Secondary | ICD-10-CM | POA: Insufficient documentation

## 2021-06-05 DIAGNOSIS — O26891 Other specified pregnancy related conditions, first trimester: Secondary | ICD-10-CM | POA: Insufficient documentation

## 2021-06-05 DIAGNOSIS — R102 Pelvic and perineal pain: Secondary | ICD-10-CM | POA: Insufficient documentation

## 2021-06-05 DIAGNOSIS — O4690 Antepartum hemorrhage, unspecified, unspecified trimester: Secondary | ICD-10-CM

## 2021-06-05 LAB — CBC
HCT: 32.4 % — ABNORMAL LOW (ref 36.0–46.0)
Hemoglobin: 11.2 g/dL — ABNORMAL LOW (ref 12.0–15.0)
MCH: 31.6 pg (ref 26.0–34.0)
MCHC: 34.6 g/dL (ref 30.0–36.0)
MCV: 91.5 fL (ref 80.0–100.0)
Platelets: 190 10*3/uL (ref 150–400)
RBC: 3.54 MIL/uL — ABNORMAL LOW (ref 3.87–5.11)
RDW: 12.2 % (ref 11.5–15.5)
WBC: 9 10*3/uL (ref 4.0–10.5)
nRBC: 0 % (ref 0.0–0.2)

## 2021-06-05 LAB — COMPREHENSIVE METABOLIC PANEL
ALT: 16 U/L (ref 0–44)
AST: 18 U/L (ref 15–41)
Albumin: 3.9 g/dL (ref 3.5–5.0)
Alkaline Phosphatase: 34 U/L — ABNORMAL LOW (ref 38–126)
Anion gap: 7 (ref 5–15)
BUN: 8 mg/dL (ref 6–20)
CO2: 21 mmol/L — ABNORMAL LOW (ref 22–32)
Calcium: 9.1 mg/dL (ref 8.9–10.3)
Chloride: 106 mmol/L (ref 98–111)
Creatinine, Ser: 0.62 mg/dL (ref 0.44–1.00)
GFR, Estimated: >60 mL/min (ref >60)
Glucose, Bld: 127 mg/dL — ABNORMAL HIGH (ref 70–99)
Potassium: 3.5 mmol/L (ref 3.5–5.1)
Sodium: 134 mmol/L — ABNORMAL LOW (ref 135–145)
Total Bilirubin: 0.5 mg/dL (ref 0.3–1.2)
Total Protein: 7.4 g/dL (ref 6.5–8.1)

## 2021-06-05 LAB — URINALYSIS, COMPLETE (UACMP) WITH MICROSCOPIC
Bilirubin Urine: NEGATIVE
Glucose, UA: NEGATIVE mg/dL
Ketones, ur: NEGATIVE mg/dL
Leukocytes,Ua: NEGATIVE
Nitrite: NEGATIVE
Protein, ur: NEGATIVE mg/dL
Specific Gravity, Urine: 1.025 (ref 1.005–1.030)
pH: 7 (ref 5.0–8.0)

## 2021-06-05 LAB — ABO/RH: ABO/RH(D): O NEG

## 2021-06-05 LAB — POC URINE PREG, ED: Preg Test, Ur: POSITIVE — AB

## 2021-06-05 NOTE — ED Notes (Signed)
Says she is [redacted] weeks pregnant with low pain abd and vaginal bleeding.  To bathroom to get urine specimen.  In nad.

## 2021-06-05 NOTE — Discharge Instructions (Addendum)
You were seen today for vaginal bleeding in pregnancy.  You had a normal ultrasound.  Your labs were unremarkable.  Do not do anything strenuous over the weekend.  Please call your OB and follow-up on Monday if symptoms persist.

## 2021-06-05 NOTE — ED Notes (Signed)
Lavender, green, and pink tubes were drawn, sent to lab and held.

## 2021-06-05 NOTE — ED Provider Notes (Signed)
[facility] Emergency Department Provider Note ____________________________________________  Time seen: 1930  I have reviewed the triage vital signs and the nursing notes.  HISTORY  Chief Complaint  Vaginal Bleeding   HPI Hannah Mcgrath is a 20 y.o. female presents to the ER today with complaint of vaginal bleeding.  She reports this started 3 days ago.  She reports the blood was light pink at first and only occurred after wiping when urinating.  She reports the blood is now a dark brown.  She reports some intermittent pelvic cramping but reports this is not severe.  She has breast tenderness but denies fatigue, nausea, vomiting or constipation.  She reports her last menstrual period was June 15.  She has seen her OB for her initial visit but they did not do an ultrasound.  Past Medical History:  Diagnosis Date   Dysmenorrhea    Human papilloma virus (HPV) type 9 vaccine administered    gardisil series complete   Ovarian cyst 2017   RT    Patient Active Problem List   Diagnosis Date Noted   Supervision of other normal pregnancy, antepartum 06/01/2021   Dysmenorrhea 11/15/2017    Past Surgical History:  Procedure Laterality Date   NO PAST SURGERIES      Prior to Admission medications   Medication Sig Start Date End Date Taking? Authorizing Provider  ondansetron (ZOFRAN ODT) 4 MG disintegrating tablet Take 1 tablet (4 mg total) by mouth every 8 (eight) hours as needed for nausea or vomiting. 05/15/21   Johnn Hai, PA-C  Prenatal Vit-Fe Fumarate-FA (MULTIVITAMIN-PRENATAL) 27-0.8 MG TABS tablet Take by mouth. 05/12/21 06/11/21  [provider]    Allergies Patient has no known allergies.  Family History  Problem Relation Age of Onset   Non-Hodgkin's lymphoma Father 25    Social History Social History   Tobacco Use   Smoking status: Never   Smokeless tobacco: Never  Vaping Use   Vaping Use: Never used  Substance Use Topics    Alcohol use: No   Drug use: Not Currently    Types: Marijuana    Comment: occ    Review of Systems  Constitutional: Negative for fever, chills or body aches. Cardiovascular: Negative for chest pain or chest tightness. Respiratory: Negative for cough or shortness of breath. Gastrointestinal: Positive for pelvic cramping.  Negative for nausea vomiting and constipation. Genitourinary: Negative for urgency, frequency, dysuria or blood in her urine. Musculoskeletal: Negative for Love back pain. ____________________________________________  PHYSICAL EXAM:  VITAL SIGNS: ED Triage Vitals  Enc Vitals Group     BP 06/05/21 1650 115/88     Pulse Rate 06/05/21 1650 78     Resp 06/05/21 1650 16     Temp 06/05/21 1650 98.7 F (37.1 C)     Temp Source 06/05/21 1650 Oral     SpO2 06/05/21 1650 99 %     Weight 06/05/21 1654 145 lb (65.8 kg)     Height 06/05/21 1654 5' 5"  (1.651 m)     Head Circumference --      Peak Flow --      Pain Score 06/05/21 1653 4     Pain Loc --      Pain Edu? --      Excl. in [place]? --     Constitutional: Alert and oriented. Well appearing and in no distress. Head: Normocephalic. Cardiovascular: Normal rate, regular rhythm.  Murmur noted. Respiratory: Normal respiratory effort. No wheezes/rales/rhonchi. Gastrointestinal: Soft and nontender.  Unable to palpate uterine fundus. GU: Deferred  Neurologic:  Normal speech and language. No gross focal neurologic deficits are appreciated. Skin:  Skin is warm, dry and intact. No rash noted.  ____________________________________________   LABS  Labs Reviewed  COMPREHENSIVE METABOLIC PANEL - Abnormal; Notable for the following components:      Result Value   Sodium 134 (*)    CO2 21 (*)    Glucose, Bld 127 (*)    Alkaline Phosphatase 34 (*)    All other components within normal limits  CBC - Abnormal; Notable for the following components:   RBC 3.54 (*)    Hemoglobin 11.2 (*)    HCT 32.4 (*)    All other  components within normal limits  URINALYSIS, COMPLETE (UACMP) WITH MICROSCOPIC - Abnormal; Notable for the following components:   Hgb urine dipstick TRACE (*)    Bacteria, UA RARE (*)    All other components within normal limits  POC URINE PREG, ED - Abnormal; Notable for the following components:   Preg Test, Ur POSITIVE (*)    All other components within normal limits  ABO/RH     ____________________________________________   RADIOLOGY Imaging Orders         US OB Comp Less 14 Wks     ____________________________________________   INITIAL IMPRESSION / ASSESSMENT AND PLAN / ED COURSE  Vaginal Bleeding in Pregnancy:  DDx include spontaneous abortion, placental abruption, subchorionic hemorrhage CBC, c-Met, ABO unremarkable. Urinalysis with trace blood in the urine consistent with vaginal bleeding Urine pregnancy test positive US OB <14 weeks shows single live intrauterine pregnancy without abnormality We will have her follow-up with OB as an outpatient ____________________________________________  FINAL CLINICAL IMPRESSION(S) / ED DIAGNOSES  Final diagnoses:  Vaginal bleeding in pregnancy      Jearld Fenton, NP 06/05/21 2015    Nance Pear, MD 06/05/21 2024

## 2021-06-05 NOTE — ED Triage Notes (Signed)
Per patient's report, patient is 8-weeks pregnant and noticed a light pink blood on the tissue on Thursday and now it's a dark brown. Patient only notices the blood when wiping after voiding.

## 2021-06-07 LAB — URINE DRUG PANEL 7
Amphetamines, Urine: NEGATIVE ng/mL
Barbiturate Quant, Ur: NEGATIVE ng/mL
Benzodiazepine Quant, Ur: NEGATIVE ng/mL
Cannabinoid Quant, Ur: POSITIVE — AB
Cocaine (Metab.): NEGATIVE ng/mL
Opiate Quant, Ur: NEGATIVE ng/mL
PCP Quant, Ur: NEGATIVE ng/mL

## 2021-06-12 ENCOUNTER — Other Ambulatory Visit: Payer: Self-pay

## 2021-06-12 ENCOUNTER — Emergency Department: Payer: Medicaid Other

## 2021-06-12 ENCOUNTER — Emergency Department
Admission: EM | Admit: 2021-06-12 | Discharge: 2021-06-12 | Disposition: A | Discharge status: Home / Self Care | Payer: Medicaid Other | Attending: Emergency Medicine | Admitting: Emergency Medicine

## 2021-06-12 DIAGNOSIS — Z3A10 10 weeks gestation of pregnancy: Secondary | ICD-10-CM | POA: Insufficient documentation

## 2021-06-12 DIAGNOSIS — O4690 Antepartum hemorrhage, unspecified, unspecified trimester: Secondary | ICD-10-CM

## 2021-06-12 DIAGNOSIS — O468X1 Other antepartum hemorrhage, first trimester: Secondary | ICD-10-CM | POA: Insufficient documentation

## 2021-06-12 DIAGNOSIS — O418X11 Other specified disorders of amniotic fluid and membranes, first trimester, fetus 1: Secondary | ICD-10-CM | POA: Insufficient documentation

## 2021-06-12 DIAGNOSIS — O26851 Spotting complicating pregnancy, first trimester: Secondary | ICD-10-CM | POA: Diagnosis not present

## 2021-06-12 DIAGNOSIS — O418X10 Other specified disorders of amniotic fluid and membranes, first trimester, not applicable or unspecified: Secondary | ICD-10-CM

## 2021-06-12 DIAGNOSIS — O0091 Unspecified ectopic pregnancy with intrauterine pregnancy: Secondary | ICD-10-CM | POA: Insufficient documentation

## 2021-06-12 LAB — CBC WITH DIFFERENTIAL/PLATELET
Abs Immature Granulocytes: 0.02 10*3/uL (ref 0.00–0.07)
Basophils Absolute: 0 10*3/uL (ref 0.0–0.1)
Basophils Relative: 0 %
Eosinophils Absolute: 0.1 10*3/uL (ref 0.0–0.5)
Eosinophils Relative: 1 %
HCT: 29.9 % — ABNORMAL LOW (ref 36.0–46.0)
Hemoglobin: 10.9 g/dL — ABNORMAL LOW (ref 12.0–15.0)
Immature Granulocytes: 0 %
Lymphocytes Relative: 30 %
Lymphs Abs: 2.4 10*3/uL (ref 0.7–4.0)
MCH: 33.1 pg (ref 26.0–34.0)
MCHC: 36.5 g/dL — ABNORMAL HIGH (ref 30.0–36.0)
MCV: 90.9 fL (ref 80.0–100.0)
Monocytes Absolute: 0.4 10*3/uL (ref 0.1–1.0)
Monocytes Relative: 5 %
Neutro Abs: 5.2 10*3/uL (ref 1.7–7.7)
Neutrophils Relative %: 64 %
Platelets: 192 10*3/uL (ref 150–400)
RBC: 3.29 MIL/uL — ABNORMAL LOW (ref 3.87–5.11)
RDW: 12.3 % (ref 11.5–15.5)
WBC: 8.1 10*3/uL (ref 4.0–10.5)
nRBC: 0 % (ref 0.0–0.2)

## 2021-06-12 LAB — ANTIBODY SCREEN: Antibody Screen: NEGATIVE

## 2021-06-12 LAB — ABO/RH: ABO/RH(D): O NEG

## 2021-06-12 LAB — HCG, QUANTITATIVE, PREGNANCY: hCG, Beta Chain, Quant, S: 171612 m[IU]/mL — ABNORMAL HIGH (ref <5)

## 2021-06-12 MED ORDER — RHO D IMMUNE GLOBULIN 1500 UNIT/2ML IJ SOSY
300.0000 ug | PREFILLED_SYRINGE | Freq: Once | INTRAMUSCULAR | Status: AC
  Administered 2021-06-12: 300 ug via INTRAMUSCULAR
  Filled 2021-06-12: qty 2

## 2021-06-12 NOTE — Discharge Instructions (Addendum)
As we discussed, any bleeding during pregnancy is considered a "threatened miscarriage".  However your ultrasound was reassuring and shows a single pregnancy at about 9 weeks and 6 days gestation which is what you expected.  You have what is called a subchorionic hemorrhage which is bleeding between the placenta and the wall of the uterus.  This often resolves on its own but sometimes it can lead to complications during pregnancy.  Please avoid sexual intercourse and follow-up with your OB/GYN at the next well opportunity to discuss additional monitoring and management.  Please let them know that you were given a dose of a medication called RhoGAM because your blood type is O-negative.  Return to the emergency department with new or worsening symptoms that concern you.

## 2021-06-12 NOTE — ED Provider Notes (Signed)
[facility] Emergency Department Provider Note  ____________________________________________   Event Date/Time   First MD Initiated Contact with Patient 06/12/21 [phone]     (approximate)  I have reviewed the triage vital signs and the nursing notes.   HISTORY  Chief Complaint Vaginal Bleeding    HPI Hannah Mcgrath is a 20 y.o. female G1 at approximately [redacted] weeks gestation who presents for evaluation of heavy vaginal bleeding starting earlier today.  She said that she has had intermittent vaginal spotting during the pregnancy and was seen in the emergency department about a week ago for it.  She had an ultrasound and blood work and was discharged for outpatient follow-up.  She has seen Dr. Bonney Aid with [facility].  She presents tonight for worsening bleeding that started earlier this evening.  She said that some blood was "falling out" when she went to the bathroom.  It has been intermittent and similar to her normal period.  A few clots have been seen but no evidence of tissue.  She has had no pain including no cramping.  She denies fever, chest pain, shortness of breath, nausea, vomiting, dysuria.  She is concerned about the symptoms given her pregnancy, describes them as moderate to severe, nothing in particular makes it better or worse.     Past Medical History:  Diagnosis Date   Dysmenorrhea    Human papilloma virus (HPV) type 9 vaccine administered    gardisil series complete   Ovarian cyst 2017   RT    Patient Active Problem List   Diagnosis Date Noted   Supervision of other normal pregnancy, antepartum 06/01/2021   Dysmenorrhea 11/15/2017    Past Surgical History:  Procedure Laterality Date   NO PAST SURGERIES      Prior to Admission medications   Medication Sig Start Date End Date Taking? Authorizing Provider  ondansetron (ZOFRAN ODT) 4 MG disintegrating tablet Take 1 tablet (4 mg total) by mouth every 8 (eight) hours as needed  for nausea or vomiting. 05/15/21   Tommi Rumps, PA-C    Allergies Patient has no known allergies.  Family History  Problem Relation Age of Onset   Non-Hodgkin's lymphoma Father 67    Social History Social History   Tobacco Use   Smoking status: Never   Smokeless tobacco: Never  Vaping Use   Vaping Use: Never used  Substance Use Topics   Alcohol use: No   Drug use: Not Currently    Types: Marijuana    Comment: occ    Review of Systems Constitutional: No fever/chills Eyes: No visual changes. ENT: No sore throat. Cardiovascular: Denies chest pain. Respiratory: Denies shortness of breath. Gastrointestinal: No abdominal pain.  No nausea, no vomiting.  No diarrhea.  No constipation. Genitourinary: Vaginal bleeding during pregnancy. Musculoskeletal: Negative for neck pain.  Negative for back pain. Integumentary: Negative for rash. Neurological: Negative for headaches, focal weakness or numbness.   ____________________________________________   PHYSICAL EXAM:  VITAL SIGNS: ED Triage Vitals  Enc Vitals Group     BP 06/12/21 0026 (!) 129/108     Pulse Rate 06/12/21 0026 80     Resp 06/12/21 0026 16     Temp 06/12/21 0026 98.8 F (37.1 C)     Temp Source 06/12/21 0026 Oral     SpO2 06/12/21 0026 96 %     Weight 06/12/21 0027 65.8 kg (145 lb)     Height 06/12/21 0027 1.651 m (5\' 5" )  Head Circumference --      Peak Flow --      Pain Score 06/12/21 0026 0     Pain Loc --      Pain Edu? --      Excl. in [place]? --     Constitutional: Alert and oriented.  Eyes: Conjunctivae are normal.  Head: Atraumatic. Nose: No congestion/rhinnorhea. Mouth/Throat: Patient is wearing a mask. Neck: No stridor.  No meningeal signs.   Cardiovascular: Normal rate, regular rhythm. Good peripheral circulation. Respiratory: Normal respiratory effort.  No retractions. Gastrointestinal: Soft and nontender. No distention.  Genitourinary: Deferred pelvic exam based on shared  decision making with the patient. Musculoskeletal: No lower extremity tenderness nor edema. No gross deformities of extremities. Neurologic:  Normal speech and language. No gross focal neurologic deficits are appreciated.  Skin:  Skin is warm, dry and intact. Psychiatric: Mood and affect are normal. Speech and behavior are normal.  ____________________________________________   LABS (all labs ordered are listed, but only abnormal results are displayed)  Labs Reviewed  HCG, QUANTITATIVE, PREGNANCY - Abnormal; Notable for the following components:      Result Value   hCG, Beta Chain, Quant, S 171,612 (*)    All other components within normal limits  CBC WITH DIFFERENTIAL/PLATELET - Abnormal; Notable for the following components:   RBC 3.29 (*)    Hemoglobin 10.9 (*)    HCT 29.9 (*)    MCHC 36.5 (*)    All other components within normal limits  RH IG WORKUP (INCLUDES ABO/RH)  ABO/RH  ANTIBODY SCREEN   ____________________________________________   RADIOLOGY Marylou Mccoy, personally viewed and evaluated these images (plain radiographs) as part of my medical decision making, as well as reviewing the written report by the radiologist.  ED MD interpretation: Single viable intrauterine pregnancy at about 9 weeks and 6 days gestation.  There is a subchorionic hemorrhage, moderate in size, that was not present previously.  Official radiology report(s): US OB Comp Less 14 Wks  Result Date: 06/12/2021 CLINICAL DATA:  Vaginal bleeding. EXAM: OBSTETRIC <14 WK ULTRASOUND TECHNIQUE: Transabdominal ultrasound was performed for evaluation of the gestation as well as the maternal uterus and adnexal regions. COMPARISON:  None. FINDINGS: Intrauterine gestational sac: Single Yolk sac:  Visualized. Embryo:  Visualized. Cardiac Activity: Visualized. Heart Rate: 167 bpm CRL:   29.6 mm   9 w 6 d                  [place] [facility]: January 09, 2022 Subchorionic hemorrhage: Moderate (approximately 3.1 cm x 0.8 cm x  1.6 cm) Maternal uterus/adnexae: The right ovary measures 3.7 cm x 2.3 cm x 2.7 cm and is normal in appearance. The left ovary measures 3.5 cm x 2.0 cm x 2.1 cm and is normal in appearance. No pelvic free fluid is seen. IMPRESSION: 1. Single, viable intrauterine pregnancy at approximately 9 weeks and 6 days gestation by ultrasound evaluation. 2. Moderate sized subchorionic hemorrhage. Electronically Signed   By: Aram Candela M.D.   On: 06/12/2021 01:28    ____________________________________________   PROCEDURES   Procedure(s) performed (including Critical Care):  Procedures   ____________________________________________   INITIAL IMPRESSION / MDM / ASSESSMENT AND PLAN / ED COURSE  As part of my medical decision making, I reviewed the following data within the electronic MEDICAL RECORD NUMBER Nursing notes reviewed and incorporated, Labs reviewed , Old chart reviewed, and Notes from prior ED visits   Differential diagnosis includes, but is not limited to, threatened  miscarriage, incomplete miscarriage, normal bleeding from an early trimester pregnancy, ectopic pregnancy, , blighted ovum, vaginal/cervical trauma, subchorionic hemorrhage/hematoma, etc.  I reviewed the medical record and see that she was evaluated previously and has O-negative blood but it does not appear she received RhoGAM during her past visit.  I have ordered it today.  Her pelvic ultrasound demonstrates a subchorionic hemorrhage as well as a single viable IUP at her expected gestational age.  We discussed performing a pelvic exam but she would prefer not to and I think that is reasonable given that it is unlikely to change management at this time.  I had my usual and customary subchorionic hemorrhage discussion with the patient and she knows to follow-up with [facility] at the next available opportunity.  CBC is reassuring with no substantial drop in hemoglobin.  Patient is stable for discharge and outpatient  follow-up.       Clinical Course as of 06/12/21 9833  Sat Jun 12, 2021  8250 I have been informed by the pharmacy Harrold Donath) that there is only 1 vial of RhoGAM in the entire  system and it is in [place], not at [facility].  I am investigating with him whether 1 vial would be sufficient for treatment for her and whether it is possible to have it sent by courier from [place].  I am concerned that the patient has already had bleeding for about 5 days and needs it as soon as possible (the 72-hour window does not apply given that she has had recent bleeding). [CF]  [phone] (delayed documentation) Harrold Donath and subsequently informed me that there was a misunderstanding and that the blood bank is the one that keeps the RhoGAM, not the pharmacy.  Blood bank called me to confirm.  We have received the RhoGAM from the blood bank and it has been administered.  I reiterated to the patient the need to follow-up at [facility] and she understands and agrees with the plan.  I gave my usual and customary return precautions. [CF]    Clinical Course User Index [CF] Loleta Rose, MD     ____________________________________________  FINAL CLINICAL IMPRESSION(S) / ED DIAGNOSES  Final diagnoses:  Vaginal bleeding in pregnancy  Subchorionic hemorrhage of placenta in first trimester, single or unspecified fetus     MEDICATIONS GIVEN DURING THIS VISIT:  Medications  rho (d) immune globulin (RHIG/RHOPHYLAC) injection 300 mcg (300 mcg Intramuscular Given 06/12/21 6734)     ED Discharge Orders     None        Note:  This document was prepared using Dragon voice recognition software and may include unintentional dictation errors.   Loleta Rose, MD 06/12/21 [phone]

## 2021-06-12 NOTE — ED Triage Notes (Signed)
Pt states is [redacted] weeks pregnant and began having heavy vaginal bleeding with clots approx 30 min pta. Pt is G1.

## 2021-06-13 LAB — RH IG WORKUP (INCLUDES ABO/RH)
Gestational Age(Wks): 10
Unit division: 0

## 2021-06-14 ENCOUNTER — Other Ambulatory Visit: Payer: Self-pay | Admitting: Obstetrics and Gynecology

## 2021-06-14 MED ORDER — VITAFOL FE+ 90-0.6-0.4-200 MG PO CAPS: 1.0000 | ORAL_CAPSULE | Freq: Every day | ORAL | 11 refills | Status: DC

## 2021-06-22 ENCOUNTER — Encounter: Payer: Self-pay | Admitting: Advanced Practice Midwife

## 2021-06-22 ENCOUNTER — Other Ambulatory Visit: Payer: Self-pay

## 2021-06-22 ENCOUNTER — Ambulatory Visit (INDEPENDENT_AMBULATORY_CARE_PROVIDER_SITE_OTHER): Payer: Medicaid Other | Admitting: Advanced Practice Midwife

## 2021-06-22 VITALS — BP 120/80 | Wt 149.0 lb

## 2021-06-22 DIAGNOSIS — Z1379 Encounter for other screening for genetic and chromosomal anomalies: Secondary | ICD-10-CM

## 2021-06-22 DIAGNOSIS — Z3401 Encounter for supervision of normal first pregnancy, first trimester: Secondary | ICD-10-CM

## 2021-06-22 DIAGNOSIS — Z369 Encounter for antenatal screening, unspecified: Secondary | ICD-10-CM

## 2021-06-22 DIAGNOSIS — Z3A11 11 weeks gestation of pregnancy: Secondary | ICD-10-CM

## 2021-06-22 LAB — POCT URINALYSIS DIPSTICK OB
Glucose, UA: NEGATIVE
POC,PROTEIN,UA: NEGATIVE

## 2021-06-22 NOTE — Addendum Note (Signed)
Addended by: Cornelius Moras D on: 06/22/2021 02:40 PM   Modules accepted: Orders

## 2021-06-22 NOTE — Progress Notes (Signed)
  Routine Prenatal Care Visit  Subjective  Hannah Mcgrath is a 20 y.o. G1P0000 at [redacted]w[redacted]d being seen today for ongoing prenatal care.  She is currently monitored for the following issues for this low-risk pregnancy and has Dysmenorrhea and Supervision of normal pregnancy on their problem list.  ----------------------------------------------------------------------------------- Patient reports her bleeding stopped after she was seen at the hospital and none since.    . Vag. Bleeding: None.   . Leaking Fluid denies.  ----------------------------------------------------------------------------------- The following portions of the patient's history were reviewed and updated as appropriate: allergies, current medications, past family history, past medical history, past social history, past surgical history and problem list. Problem list updated.  Objective  Blood pressure 120/80, weight 149 lb (67.6 kg), last menstrual period 04/02/2021. Pregravid weight 140 lb (63.5 kg) Total Weight Gain 9 lb (4.082 kg) Urinalysis: Urine Protein    Urine Glucose    Fetal Status: Fetal Heart Rate (bpm): 156         General:  Alert, oriented and cooperative. Patient is in no acute distress.  Skin: Skin is warm and dry. No rash noted.   Cardiovascular: Normal heart rate noted  Respiratory: Normal respiratory effort, no problems with respiration noted  Abdomen: Soft, gravid, appropriate for gestational age. Pain/Pressure: Absent     Pelvic:  Cervical exam deferred        Extremities: Normal range of motion.     Mental Status: Normal mood and affect. Normal behavior. Normal judgment and thought content.   Assessment   20 y.o. G1P0000 at [redacted]w[redacted]d by  01/07/2022, by Last Menstrual Period presenting for routine prenatal visit  Plan   pregnancy 1 Problems (from 06/01/21 to present)    Problem Noted Resolved   Supervision of normal pregnancy 06/01/2021 by Vena Austria, MD No   Overview Addendum 06/07/2021  5:57  PM by Vena Austria, MD     Nursing Staff Provider  Office Location  [facility] Dating  LMP = [redacted]w[redacted]d [place]  Language  English Anatomy US    Flu Vaccine   Genetic Screen  NIPS:   TDaP vaccine    Hgb A1C or  GTT Early: N/A Third trimester :   Covid    LAB RESULTS   Rhogam  [ ]  28 weeks Blood Type O negative  Feeding Plan  Antibody negative  Contraception  Rubella Immune  Circumcision  RPR NR  Pediatrician   HBsAg negative  Support Person  HIV negative  Prenatal Classes  Varicella Immune    GBS  (For PCN allergy, check sensitivities)   BTL Consent     VBAC Consent  Pap  LGSIL repeat in 12 months <24    Hgb Electro      CF      SMA     HepC negative             Preterm labor symptoms and general obstetric precautions including but not limited to vaginal bleeding, contractions, leaking of fluid and fetal movement were reviewed in detail with the patient. Please refer to After Visit Summary for other counseling recommendations.   Return in about 4 weeks (around 07/20/2021) for rob.  09/19/2021, CNM 06/22/2021 2:24 PM

## 2021-06-22 NOTE — Patient Instructions (Signed)

## 2021-06-27 LAB — MATERNIT21 PLUS CORE+SCA
Fetal Fraction: 18
Monosomy X (Turner Syndrome): NOT DETECTED
Result (T21): NEGATIVE
Trisomy 13 (Patau syndrome): NEGATIVE
Trisomy 18 (Edwards syndrome): NEGATIVE
Trisomy 21 (Down syndrome): NEGATIVE
XXX (Triple X Syndrome): NOT DETECTED
XXY (Klinefelter Syndrome): NOT DETECTED
XYY (Jacobs Syndrome): NOT DETECTED

## 2021-07-01 LAB — INHERITEST CORE(CF97,SMA,FRAX)

## 2021-07-20 ENCOUNTER — Ambulatory Visit (INDEPENDENT_AMBULATORY_CARE_PROVIDER_SITE_OTHER): Payer: Medicaid Other | Admitting: Advanced Practice Midwife

## 2021-07-20 ENCOUNTER — Other Ambulatory Visit: Payer: Self-pay

## 2021-07-20 ENCOUNTER — Encounter: Payer: Self-pay | Admitting: Advanced Practice Midwife

## 2021-07-20 ENCOUNTER — Encounter: Payer: Medicaid Other | Admitting: Obstetrics and Gynecology

## 2021-07-20 VITALS — BP 122/76 | Wt 159.0 lb

## 2021-07-20 DIAGNOSIS — Z3A15 15 weeks gestation of pregnancy: Secondary | ICD-10-CM

## 2021-07-20 DIAGNOSIS — Z3402 Encounter for supervision of normal first pregnancy, second trimester: Secondary | ICD-10-CM

## 2021-07-20 DIAGNOSIS — Z369 Encounter for antenatal screening, unspecified: Secondary | ICD-10-CM

## 2021-07-20 MED ORDER — VITAFOL FE+ 90-0.6-0.4-200 MG PO CAPS: 1.0000 | ORAL_CAPSULE | Freq: Every day | ORAL | 11 refills | Status: DC

## 2021-07-20 NOTE — Progress Notes (Signed)
  Routine Prenatal Care Visit  Subjective  Hannah Mcgrath is a 20 y.o. G1P0000 at [redacted]w[redacted]d being seen today for ongoing prenatal care.  She is currently monitored for the following issues for this low-risk pregnancy and has Dysmenorrhea and Supervision of normal pregnancy on their problem list.  ----------------------------------------------------------------------------------- Patient reports no complaints.    . Vag. Bleeding: None.  Movement: Absent. Leaking Fluid denies.  ----------------------------------------------------------------------------------- The following portions of the patient's history were reviewed and updated as appropriate: allergies, current medications, past family history, past medical history, past social history, past surgical history and problem list. Problem list updated.  Objective  Blood pressure 122/76, weight 159 lb (72.1 kg), last menstrual period 04/02/2021. Pregravid weight 140 lb (63.5 kg) Total Weight Gain 19 lb (8.618 kg) Urinalysis: Urine Protein    Urine Glucose    Fetal Status: Fetal Heart Rate (bpm): 148   Movement: Absent     General:  Alert, oriented and cooperative. Patient is in no acute distress.  Skin: Skin is warm and dry. No rash noted.   Cardiovascular: Normal heart rate noted  Respiratory: Normal respiratory effort, no problems with respiration noted  Abdomen: Soft, gravid, appropriate for gestational age. Pain/Pressure: Absent     Pelvic:  Cervical exam deferred        Extremities: Normal range of motion.  Edema: None  Mental Status: Normal mood and affect. Normal behavior. Normal judgment and thought content.   Assessment   20 y.o. G1P0000 at [redacted]w[redacted]d by  01/07/2022, by Last Menstrual Period presenting for routine prenatal visit  Plan   pregnancy 1 Problems (from 06/01/21 to present)    Problem Noted Resolved   Supervision of normal pregnancy 06/01/2021 by Vena Austria, MD No   Overview Addendum 06/07/2021  5:57 PM by Vena Austria, MD     Nursing Staff Provider  Office Location  [facility] Dating  LMP = [redacted]w[redacted]d [place]  Language  English Anatomy US    Flu Vaccine   Genetic Screen  NIPS:   TDaP vaccine    Hgb A1C or  GTT Early: N/A Third trimester :   Covid    LAB RESULTS   Rhogam  [ ]  28 weeks Blood Type O negative  Feeding Plan  Antibody negative  Contraception  Rubella Immune  Circumcision  RPR NR  Pediatrician   HBsAg negative  Support Person  HIV negative  Prenatal Classes  Varicella Immune    GBS  (For PCN allergy, check sensitivities)   BTL Consent     VBAC Consent  Pap  LGSIL repeat in 12 months <24    Hgb Electro      CF      SMA     HepC negative             Preterm labor symptoms and general obstetric precautions including but not limited to vaginal bleeding, contractions, leaking of fluid and fetal movement were reviewed in detail with the patient. Please refer to After Visit Summary for other counseling recommendations.   Return in about 4 weeks (around 08/17/2021) for anatomy scan and rob.  13/10/2020, CNM 07/20/2021 4:38 PM

## 2021-07-20 NOTE — Progress Notes (Signed)
ROB

## 2021-08-03 IMAGING — MR MR PELVIS W/O CM
8 of 11 series · 29 of 48 positions shown · non-contrast
Comparison: None.

CLINICAL DATA: Acute onset of right lower quadrant pain and nausea.
First trimester pregnancy. Clinical suspicion for appendicitis.

EXAM:
MRI ABDOMEN AND PELVIS WITHOUT CONTRAST
TECHNIQUE: Multiplanar multisequence MR imaging of the abdomen and pelvis was
performed. No intravenous contrast was administered.

[Series 5: T2 · axial · 4.0mm · 1.00mm/px · z∈[-198,+97]mm · 4 of 60 slices shown (1 of 2)]
[im 1/60]
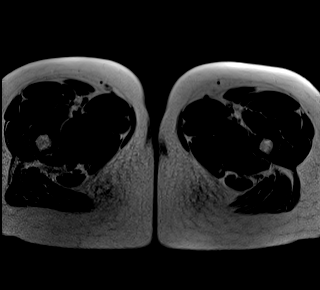
[im 20/60]
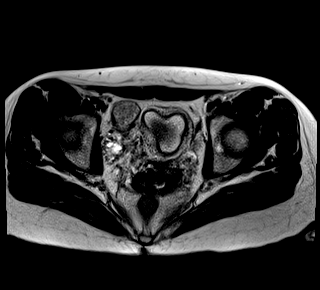
[im 40/60]
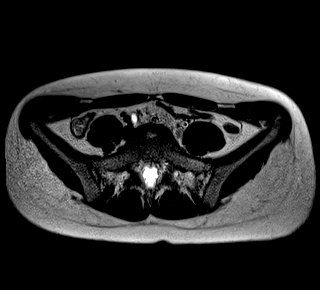
[im 60/60]
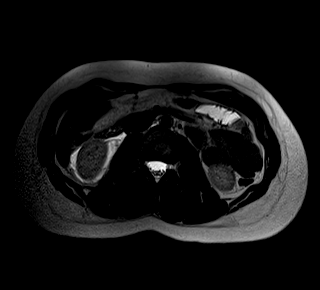

[Series 6: T2 fat-sat · axial · 4.0mm · 1.00mm/px · z∈[-198,+97]mm · 4 of 60 slices shown (1 of 2)]
[im 1/60]
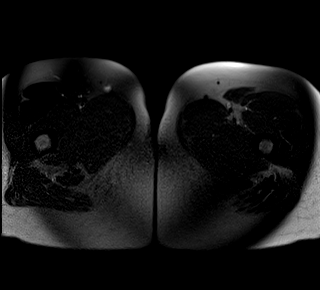
[im 20/60]
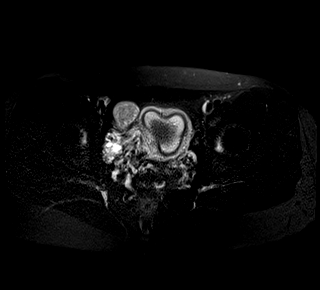
[im 40/60]
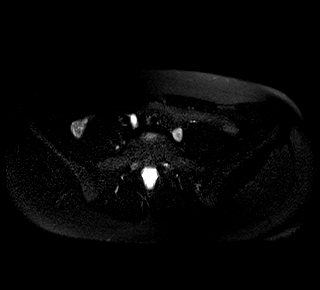
[im 60/60]
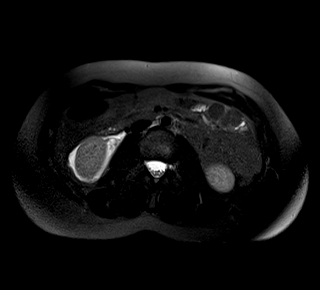

[Series 7: bSSFP · axial · 4.0mm · 0.62mm/px · z∈[-198,+97]mm · 4 of 60 slices shown (1 of 3)]
[im 1/60]
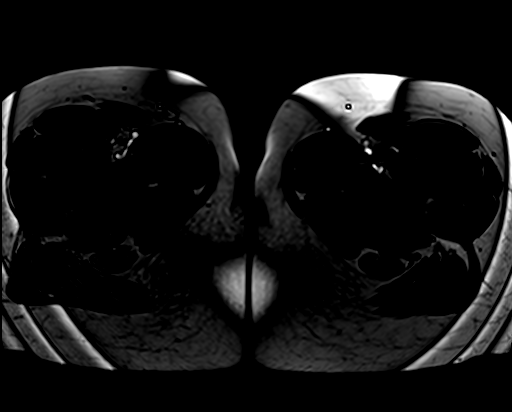
[im 20/60]
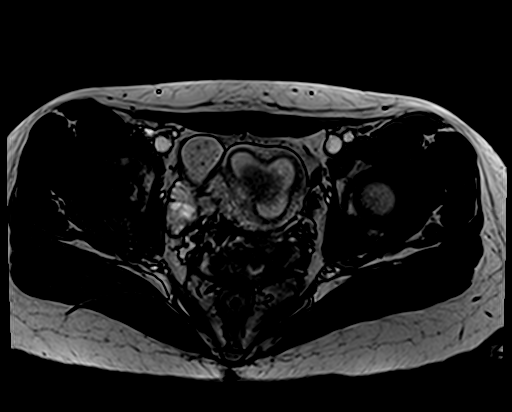
[im 40/60]
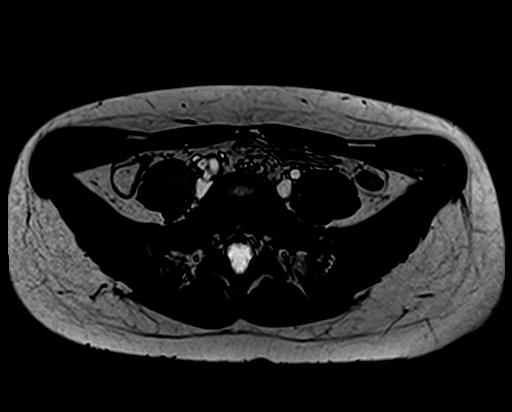
[im 60/60]
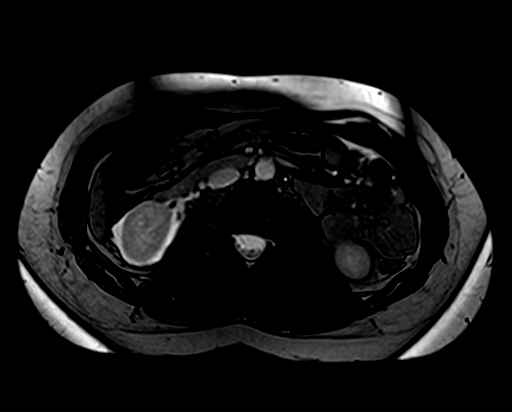

[Series 8: cor haste fs · coronal · 4.0mm · 1.25mm/px · 4 of 43 slices shown]
[im 1/43]
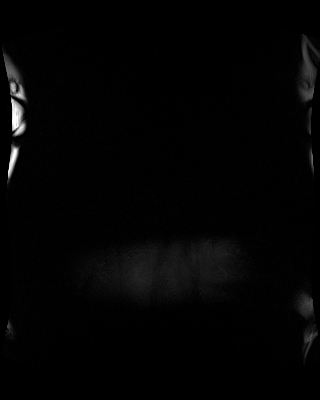
[im 15/43]
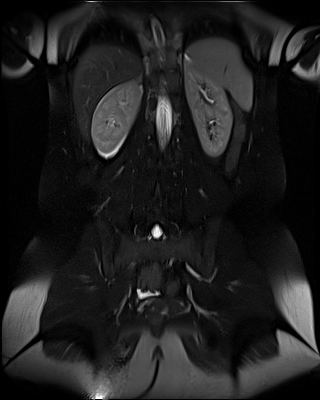
[im 29/43]
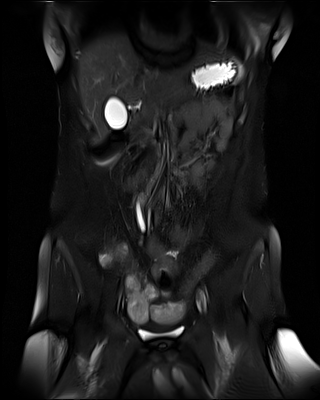
[im 43/43]
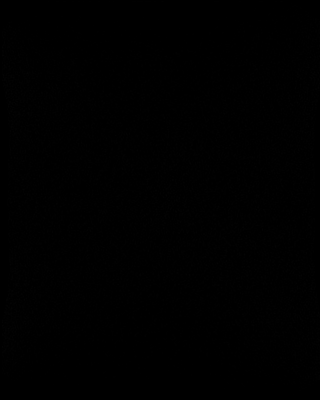

[Series 9: bSSFP · coronal · 4.0mm · 0.62mm/px · 4 of 44 slices shown (2 of 3)]
[im 1/44]
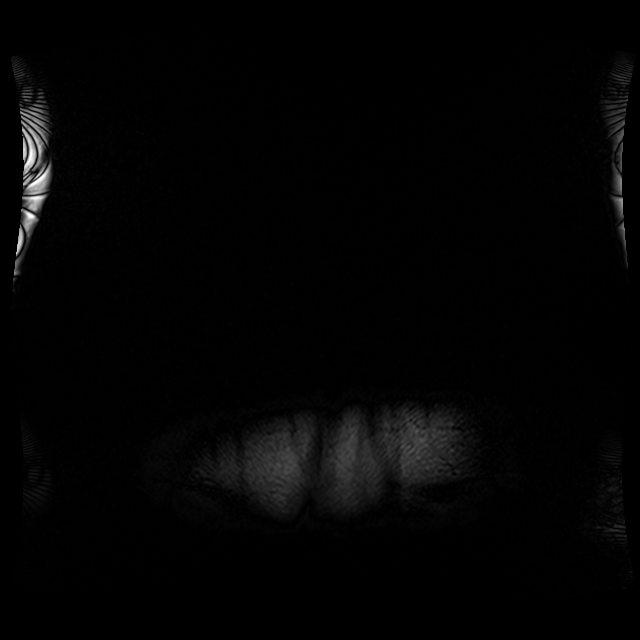
[im 15/44]
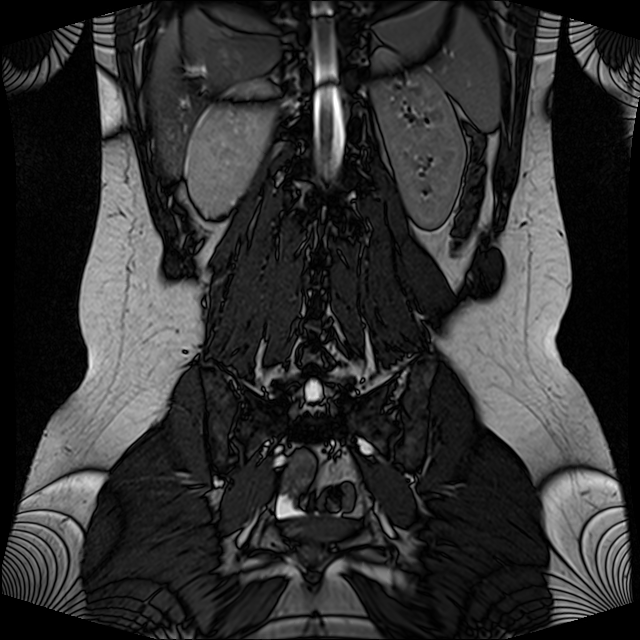
[im 29/44]
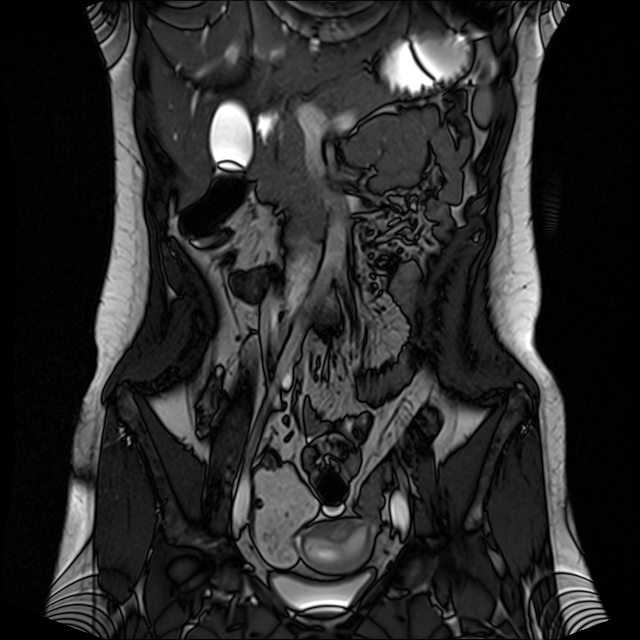
[im 44/44]
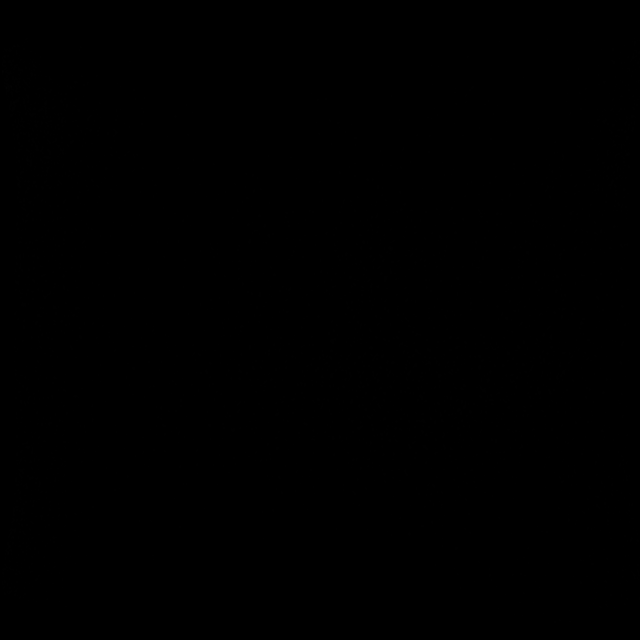

[Series 10: T2 · axial · 5.0mm · 1.00mm/px · z∈[+80,+266]mm · 3 of 32 slices shown (2 of 2)]
[im 1/32]
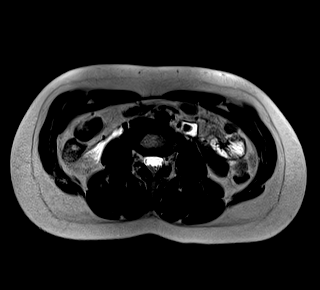
[im 16/32]
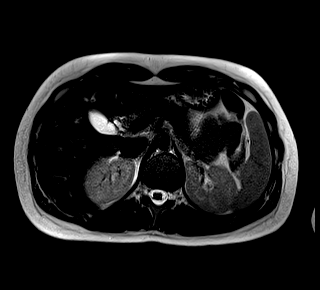
[im 32/32]
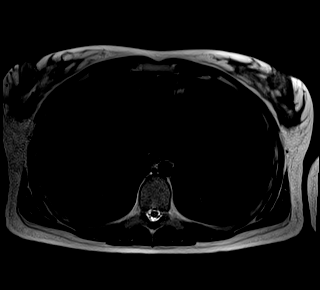

[Series 11: T2 fat-sat · axial · 5.0mm · 1.00mm/px · z∈[+80,+266]mm · 3 of 32 slices shown (2 of 2)]
[im 1/32]
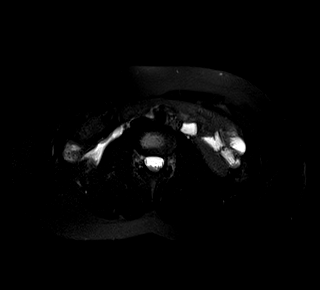
[im 16/32]
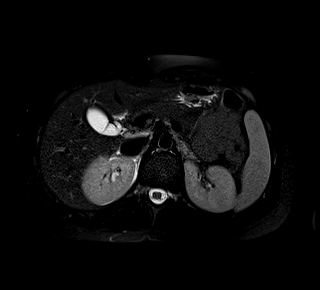
[im 32/32]
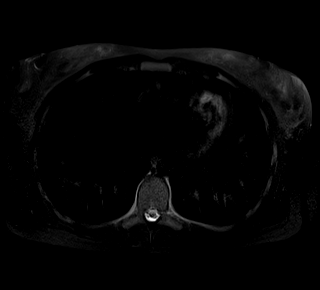

[Series 12: bSSFP · axial · 5.0mm · 0.62mm/px · z∈[+80,+266]mm · 3 of 32 slices shown (3 of 3)]
[im 1/32]
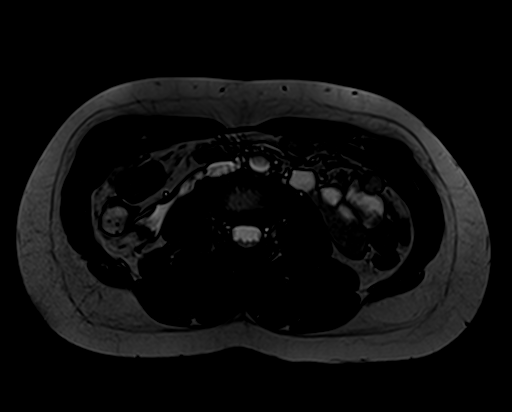
[im 16/32]
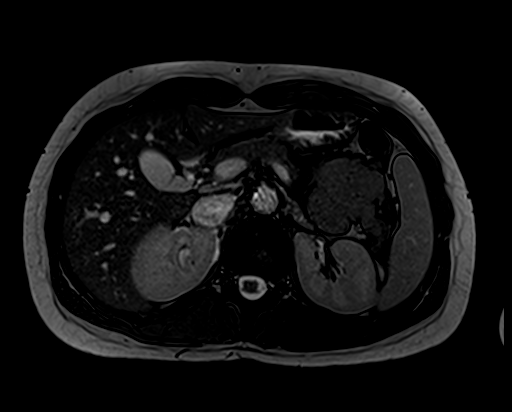
[im 32/32]
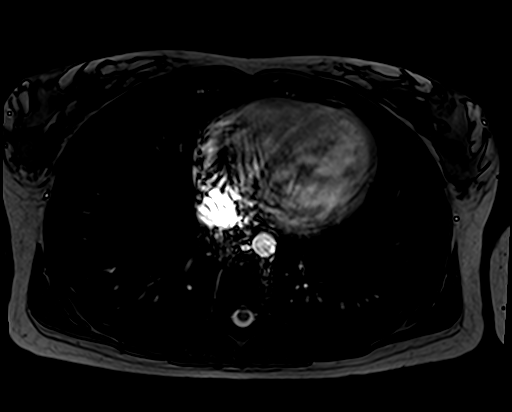

[29 of 48 positions shown; findings below may reference images not displayed]

FINDINGS: COMBINED FINDINGS FOR BOTH MR ABDOMEN AND PELVIS

Lower chest: No acute findings.

Hepatobiliary: No mass visualized on this unenhanced exam.
Gallbladder is unremarkable. No evidence of biliary ductal
dilatation.

Pancreas: No mass or inflammatory process visualized on this
unenhanced exam.

Spleen:  Within normal limits in size.

Adrenals/Urinary tract: No evidence of renal mass or abscess. No
evidence of left-sided hydronephrosis. Mild right
hydroureteronephrosis and perinephric fluid is seen. Mild dilatation
of the right ureter is seen extending nearly to the urinary bladder,
and this raises suspicion for a tiny distal right ureteral calculus
although this cannot be directly visualized by MR.

Stomach/Bowel: Cecum is low lying. Although the appendix is not
directly visualized, no inflammatory process seen in region of the
cecum or elsewhere. No evidence of bowel obstruction.

Vascular/Lymphatic: No pathologically enlarged lymph nodes
identified. No evidence of abdominal aortic aneurysm.

Reproductive: A single early intrauterine gestational sac is seen.
Both ovaries are normal appearance. No adnexal mass identified. Tiny
amount of free fluid noted in the pelvic cul-de-sac.

Other:  None.

Musculoskeletal:  No suspicious bone lesions identified.
IMPRESSION: Mild right perinephric fluid with hydroureteronephrosis extending to
near the urinary bladder. This raises suspicion for a possible tiny
distal right ureteral calculus, although this cannot be directly
visualized by MR.

No definite radiographic signs of appendicitis.

Single early intrauterine gestational sac.

## 2021-08-13 ENCOUNTER — Ambulatory Visit
Admission: RE | Admit: 2021-08-13 | Discharge: 2021-08-13 | Disposition: A | Discharge status: Home / Self Care | Payer: Medicaid Other | Source: Ambulatory Visit | Attending: Advanced Practice Midwife | Admitting: Advanced Practice Midwife

## 2021-08-13 ENCOUNTER — Other Ambulatory Visit: Payer: Self-pay

## 2021-08-13 DIAGNOSIS — Z369 Encounter for antenatal screening, unspecified: Secondary | ICD-10-CM | POA: Insufficient documentation

## 2021-08-13 DIAGNOSIS — Z3402 Encounter for supervision of normal first pregnancy, second trimester: Secondary | ICD-10-CM | POA: Insufficient documentation

## 2021-08-17 ENCOUNTER — Other Ambulatory Visit: Payer: Self-pay

## 2021-08-17 ENCOUNTER — Ambulatory Visit (INDEPENDENT_AMBULATORY_CARE_PROVIDER_SITE_OTHER): Payer: Medicaid Other | Admitting: Obstetrics

## 2021-08-17 ENCOUNTER — Other Ambulatory Visit (HOSPITAL_COMMUNITY)
Admission: RE | Admit: 2021-08-17 | Discharge: 2021-08-17 | Disposition: A | Discharge status: Home / Self Care | Payer: Medicaid Other | Source: Ambulatory Visit | Attending: Obstetrics | Admitting: Obstetrics

## 2021-08-17 VITALS — BP 118/72 | Wt 170.0 lb

## 2021-08-17 DIAGNOSIS — Z113 Encounter for screening for infections with a predominantly sexual mode of transmission: Secondary | ICD-10-CM | POA: Insufficient documentation

## 2021-08-17 DIAGNOSIS — Z3402 Encounter for supervision of normal first pregnancy, second trimester: Secondary | ICD-10-CM

## 2021-08-17 DIAGNOSIS — Z3A19 19 weeks gestation of pregnancy: Secondary | ICD-10-CM

## 2021-08-17 LAB — POCT URINALYSIS DIPSTICK OB
Glucose, UA: NEGATIVE
POC,PROTEIN,UA: NEGATIVE

## 2021-08-17 NOTE — Progress Notes (Signed)
ROB - anatomy [facility]), requesting STD testing. RM 4

## 2021-08-17 NOTE — Progress Notes (Signed)
  Routine Prenatal Care Visit  Subjective  Hannah Mcgrath is a 20 y.o. G1P0000 at [redacted]w[redacted]d being seen today for ongoing prenatal care.  She is currently monitored for the following issues for this low-risk pregnancy and has Dysmenorrhea and Supervision of normal pregnancy on their problem list.  ----------------------------------------------------------------------------------- Patient reports her partner has "cheated' on her. Wants STI screening today.Lives with her mother and the BF in mom's house.    .  .   Pincus Large Fluid denies.  ----------------------------------------------------------------------------------- The following portions of the patient's history were reviewed and updated as appropriate: allergies, current medications, past family history, past medical history, past social history, past surgical history and problem list. Problem list updated.  Objective  Blood pressure 118/72, weight 170 lb (77.1 kg), last menstrual period 04/02/2021. Pregravid weight 140 lb (63.5 kg) Total Weight Gain 30 lb (13.6 kg) Urinalysis: Urine Protein    Urine Glucose    Fetal Status:           General:  Alert, oriented and cooperative. Patient is in no acute distress.  Skin: Skin is warm and dry. No rash noted.   Cardiovascular: Normal heart rate noted  Respiratory: Normal respiratory effort, no problems with respiration noted  Abdomen: Soft, gravid, appropriate for gestational age.       Pelvic:  Cervical exam deferred        Extremities: Normal range of motion.     Mental Status: Normal mood and affect. Normal behavior. Normal judgment and thought content.   Assessment   20 y.o. G1P0000 at [redacted]w[redacted]d by  01/07/2022, by Last Menstrual Period presenting for routine prenatal visit  Plan   pregnancy 1 Problems (from 06/01/21 to present)    Problem Noted Resolved   Supervision of normal pregnancy 06/01/2021 by Vena Austria, MD No   Overview Addendum 06/07/2021  5:57 PM by Vena Austria, MD      Nursing Staff Provider  Office Location  [facility] Dating  LMP = [redacted]w[redacted]d [place]  Language  English Anatomy US    Flu Vaccine   Genetic Screen  NIPS:   TDaP vaccine    Hgb A1C or  GTT Early: N/A Third trimester :   Covid    LAB RESULTS   Rhogam  [ ]  28 weeks Blood Type O negative  Feeding Plan  Antibody negative  Contraception  Rubella Immune  Circumcision  RPR NR  Pediatrician   HBsAg negative  Support Person  HIV negative  Prenatal Classes  Varicella Immune    GBS  (For PCN allergy, check sensitivities)   BTL Consent     VBAC Consent  Pap  LGSIL repeat in 12 months <24    Hgb Electro      CF      SMA     HepC negative             Preterm labor symptoms and general obstetric precautions including but not limited to vaginal bleeding, contractions, leaking of fluid and fetal movement were reviewed in detail with the patient. Please refer to After Visit Summary for other counseling recommendations.  STI screening retrieved.  Return in about 4 weeks (around 09/14/2021) for return OB.  09/16/2021, CNM  08/17/2021 2:00 PM

## 2021-08-17 NOTE — Addendum Note (Signed)
Addended by: Fortunato Curling R on: 08/17/2021 02:40 PM   Modules accepted: Orders

## 2021-08-19 LAB — CERVICOVAGINAL ANCILLARY ONLY
Bacterial Vaginitis (gardnerella): NEGATIVE
Candida Glabrata: NEGATIVE
Candida Vaginitis: POSITIVE — AB
Chlamydia: NEGATIVE
Comment: NEGATIVE
Comment: NEGATIVE
Comment: NEGATIVE
Comment: NEGATIVE
Comment: NEGATIVE
Comment: NORMAL
Neisseria Gonorrhea: NEGATIVE
Trichomonas: NEGATIVE

## 2021-08-20 ENCOUNTER — Other Ambulatory Visit: Payer: Self-pay | Admitting: Advanced Practice Midwife

## 2021-08-20 DIAGNOSIS — Z369 Encounter for antenatal screening, unspecified: Secondary | ICD-10-CM

## 2021-08-20 DIAGNOSIS — Z3402 Encounter for supervision of normal first pregnancy, second trimester: Secondary | ICD-10-CM

## 2021-08-20 NOTE — Progress Notes (Signed)
F/u anatomy ordered.

## 2021-08-21 IMAGING — US US OB COMP LESS 14 WK
1 series · 14 of 28 positions shown · non-contrast
Comparison: None.

CLINICAL DATA: Vaginal bleeding. Estimated gestational age by LMP
is 9 weeks 1 day. Quantitative beta HCG is not provided.

EXAM:
OBSTETRIC <14 WK ULTRASOUND
TECHNIQUE: Transabdominal ultrasound was performed for evaluation of the
gestation as well as the maternal uterus and adnexal regions.

[Series 1: us ob comp less 14 wks · 151 acquisitions, 14 frames shown]
[im 6/151]
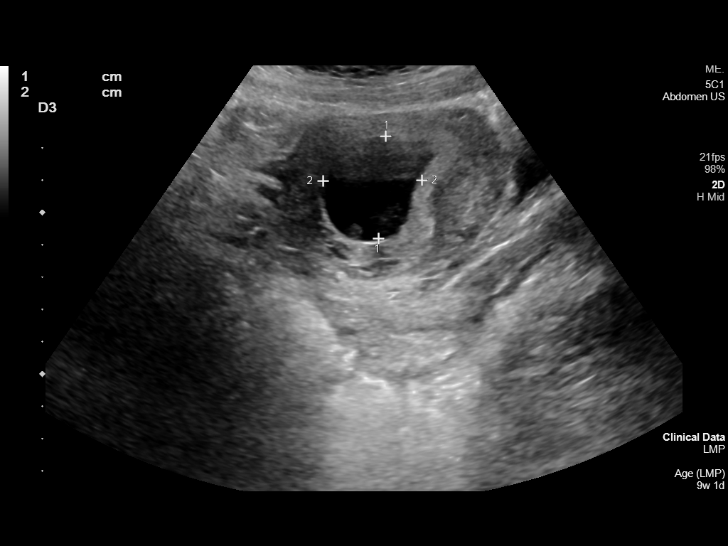
[im 17/151]
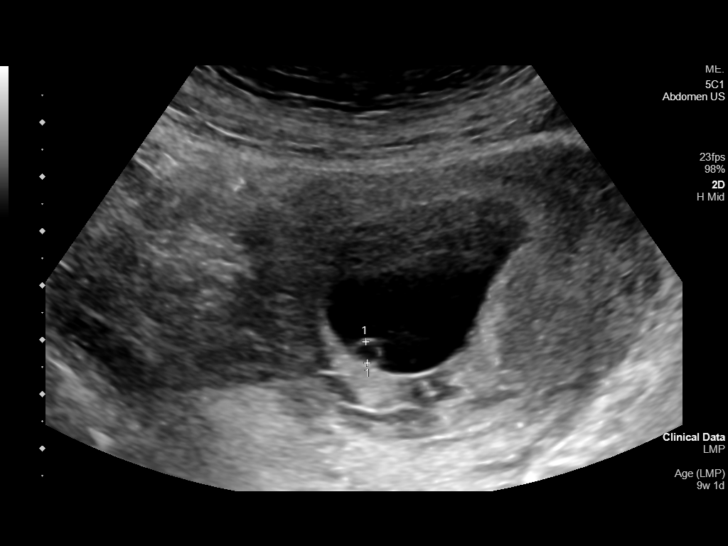
[im 28/151]
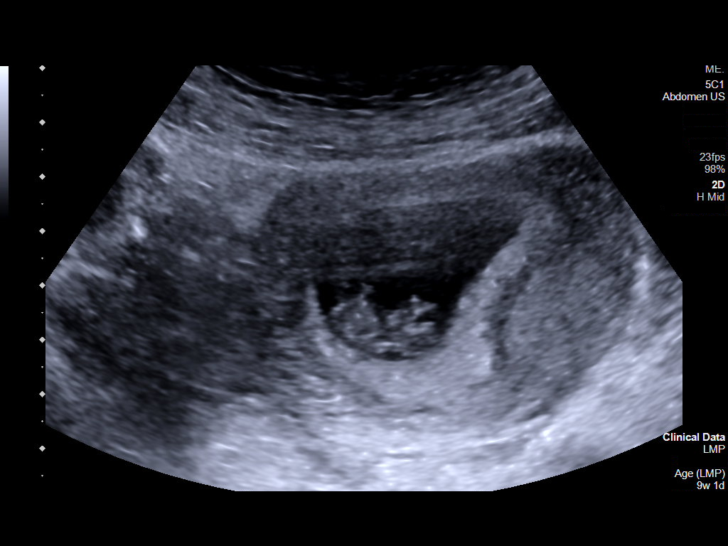
[im 39/151]
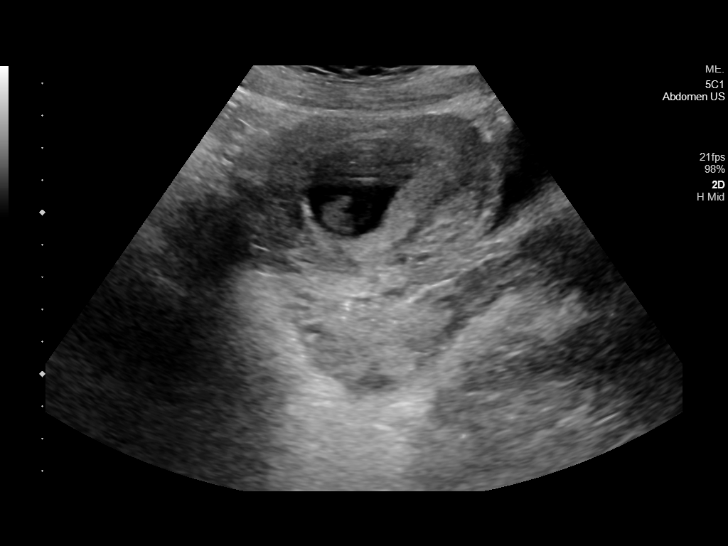
[im 51/151]
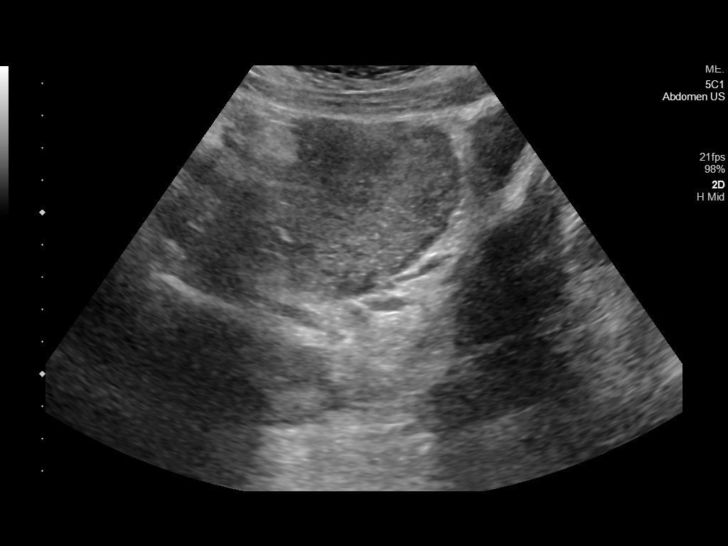
[im 62/151]
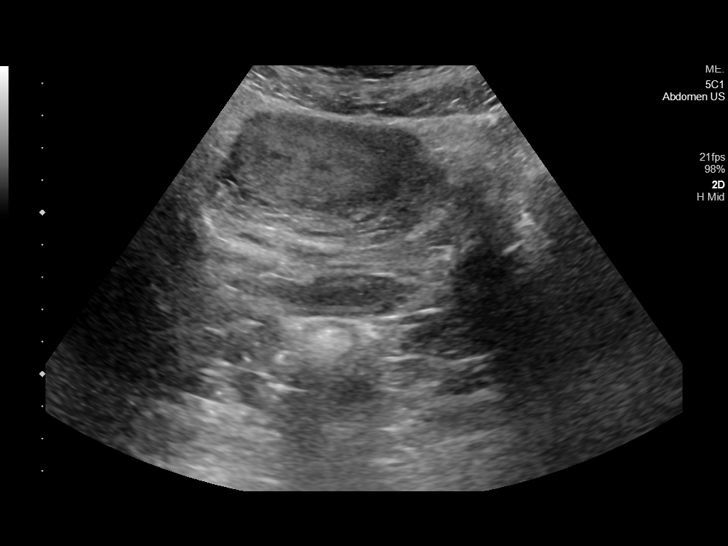
[im 73/151]
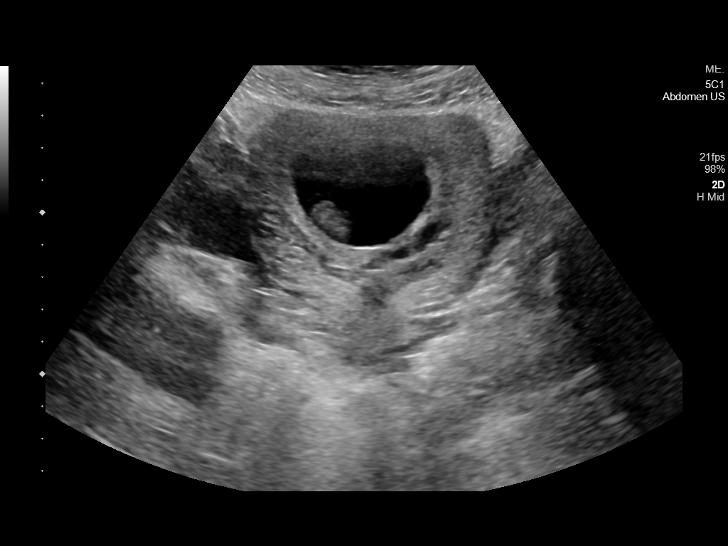
[im 84/151]
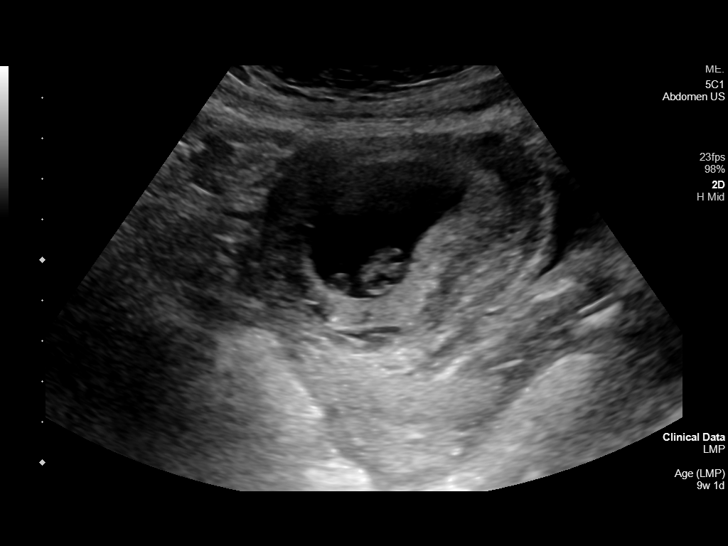
[im 95/151]
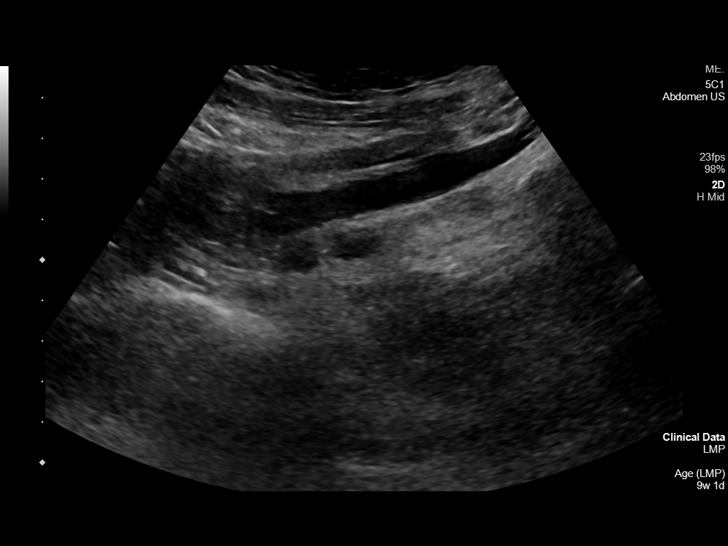
[im 106/151]
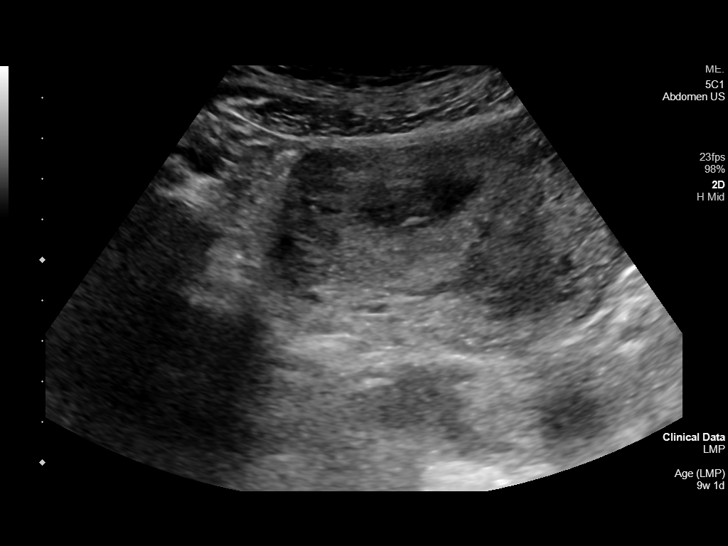
[im 117/151]
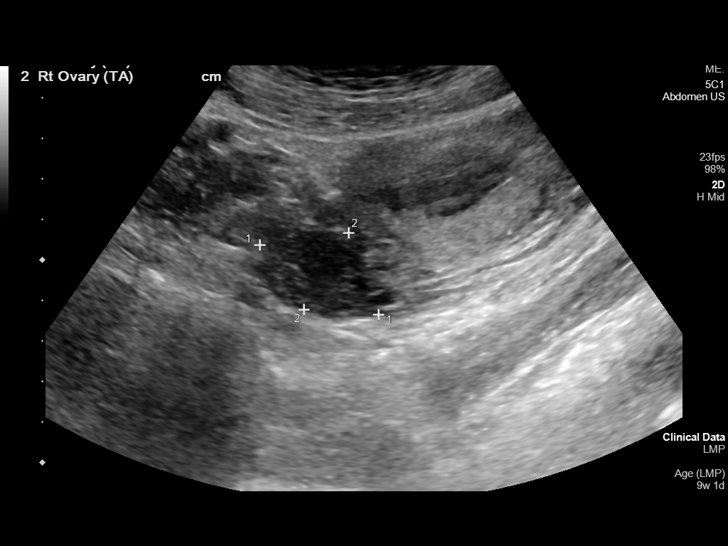
[im 128/151]
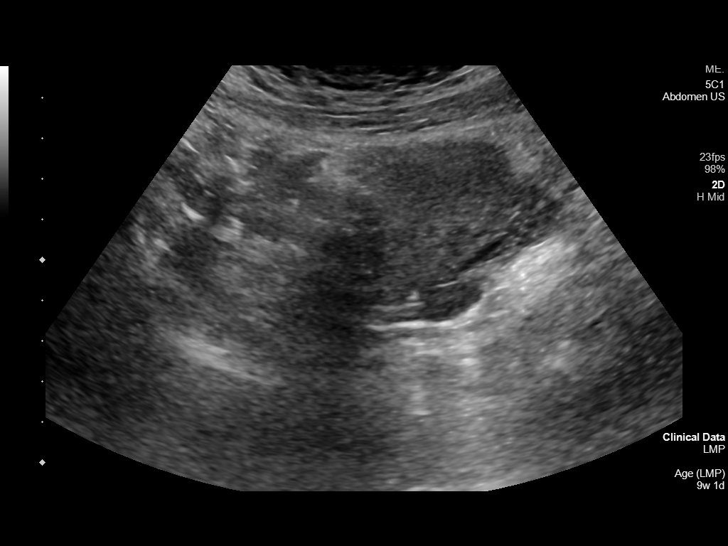
[im 139/151]
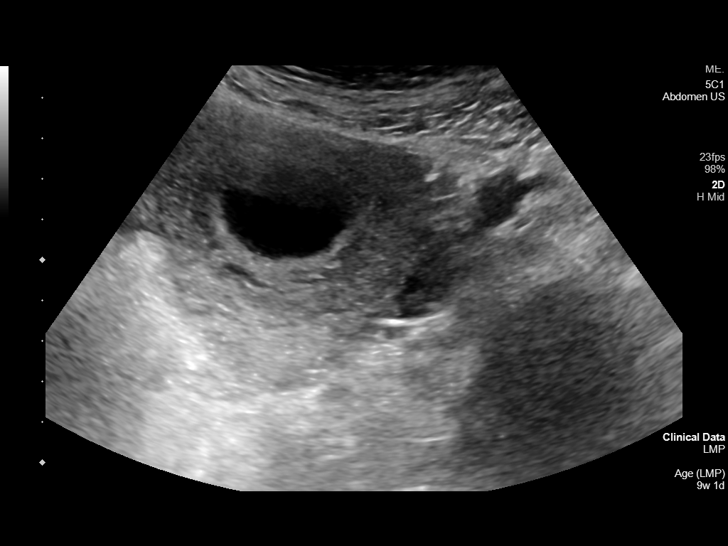
[im 151/151]
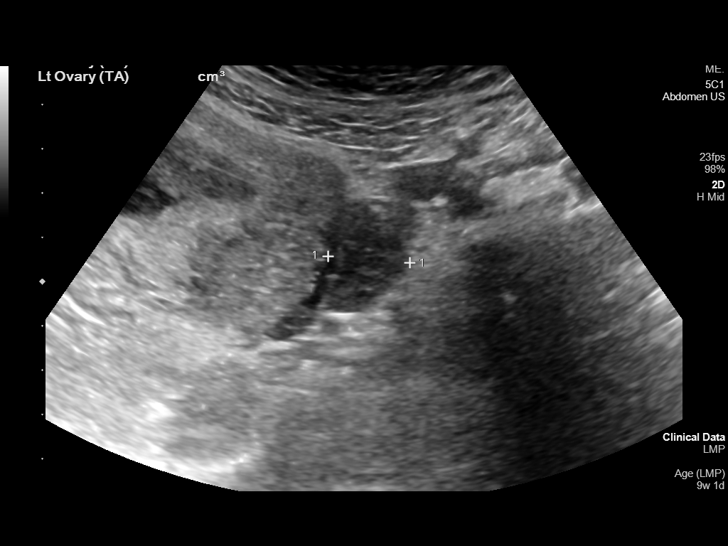

[14 of 28 positions shown; findings below may reference images not displayed]

FINDINGS: Intrauterine gestational sac: A single intrauterine gestational sac
is visualized.

Yolk sac:  The yolk sac is present.

Embryo:  Fetal pole is identified.

Cardiac Activity: Fetal cardiac activity is present.

Heart Rate: 167 bpm

CRL: 23 mm   9 w 0 d                  US EDC: 01/08/2021

Subchorionic hemorrhage:  None visualized.

Maternal uterus/adnexae: Uterus is anteverted. No myometrial mass
lesions are identified. Both ovaries are visualized and appear
normal. No abnormal adnexal masses or fluid collections. No free
fluid.
IMPRESSION: Single intrauterine pregnancy with estimated gestational age by
crown-rump length of 9 weeks 0 days. No acute complication is
suggested sonographically.

## 2021-08-25 ENCOUNTER — Other Ambulatory Visit: Payer: Self-pay | Admitting: Obstetrics

## 2021-08-25 NOTE — Progress Notes (Signed)
Voicemail left of pateint regarding recent testing for infection- just yeast vaginitis noted- instructed her to purchase some Monistat cream to treat.  Mirna Mires, CNM  08/25/2021 12:43 PM

## 2021-09-08 ENCOUNTER — Other Ambulatory Visit: Payer: Self-pay

## 2021-09-08 ENCOUNTER — Ambulatory Visit
Admission: RE | Admit: 2021-09-08 | Discharge: 2021-09-08 | Disposition: A | Discharge status: Home / Self Care | Payer: Medicaid Other | Source: Ambulatory Visit | Attending: Advanced Practice Midwife | Admitting: Advanced Practice Midwife

## 2021-09-08 DIAGNOSIS — Z3402 Encounter for supervision of normal first pregnancy, second trimester: Secondary | ICD-10-CM | POA: Diagnosis not present

## 2021-09-08 DIAGNOSIS — Z369 Encounter for antenatal screening, unspecified: Secondary | ICD-10-CM

## 2021-09-15 ENCOUNTER — Other Ambulatory Visit: Payer: Self-pay

## 2021-09-15 ENCOUNTER — Ambulatory Visit (INDEPENDENT_AMBULATORY_CARE_PROVIDER_SITE_OTHER): Payer: Medicaid Other | Admitting: Obstetrics

## 2021-09-15 VITALS — BP 110/70 | Ht 65.0 in | Wt 183.8 lb

## 2021-09-15 DIAGNOSIS — Z3402 Encounter for supervision of normal first pregnancy, second trimester: Secondary | ICD-10-CM

## 2021-09-15 DIAGNOSIS — Z3A23 23 weeks gestation of pregnancy: Secondary | ICD-10-CM

## 2021-09-15 LAB — POCT URINALYSIS DIPSTICK OB
Glucose, UA: NEGATIVE
POC,PROTEIN,UA: NEGATIVE

## 2021-09-15 NOTE — Progress Notes (Signed)
  Routine Prenatal Care Visit  Subjective  Hannah Mcgrath is a 20 y.o. G1P0000 at [redacted]w[redacted]d being seen today for ongoing prenatal care.  She is currently monitored for the following issues for this low-risk pregnancy and has Dysmenorrhea and Supervision of normal pregnancy on their problem list.  ----------------------------------------------------------------------------------- Patient reports no complaints.  She fells ample fetal movement. She does describe several incidences of vision changes, not associated with headaches or movement. She suspects this has happened when she has not eaten. Contractions: Not present. Vag. Bleeding: None.  Movement: Present. Leaking Fluid denies.  ----------------------------------------------------------------------------------- The following portions of the patient's history were reviewed and updated as appropriate: allergies, current medications, past family history, past medical history, past social history, past surgical history and problem list. Problem list updated.  Objective  Blood pressure 110/70, height 5\' 5"  (1.651 m), weight 183 lb 12.8 oz (83.4 kg), last menstrual period 04/02/2021. Pregravid weight 140 lb (63.5 kg) Total Weight Gain 43 lb 12.8 oz (19.9 kg) Urinalysis: Urine Protein    Urine Glucose    Fetal Status:     Movement: Present     General:  Alert, oriented and cooperative. Patient is in no acute distress.  Skin: Skin is warm and dry. No rash noted.   Cardiovascular: Normal heart rate noted  Respiratory: Normal respiratory effort, no problems with respiration noted  Abdomen: Soft, gravid, appropriate for gestational age. Pain/Pressure: Absent     Pelvic:  Cervical exam deferred        Extremities: Normal range of motion.     Mental Status: Normal mood and affect. Normal behavior. Normal judgment and thought content.   Assessment   20 y.o. G1P0000 at [redacted]w[redacted]d by  01/07/2022, by Last Menstrual Period presenting for routine prenatal  visit  Plan   pregnancy 1 Problems (from 06/01/21 to present)    Problem Noted Resolved   Supervision of normal pregnancy 06/01/2021 by 06/03/2021, MD No   Overview Addendum 06/07/2021  5:57 PM by 06/09/2021, MD     Nursing Staff Provider  Office Location  [facility] Dating  LMP = [redacted]w[redacted]d [redacted]w[redacted]d  Language  English Anatomy US    Flu Vaccine   Genetic Screen  NIPS:   TDaP vaccine    Hgb A1C or  GTT Early: N/A Third trimester :   Covid    LAB RESULTS   Rhogam  [ ]  28 weeks Blood Type O negative  Feeding Plan  Antibody negative  Contraception  Rubella Immune  Circumcision  RPR NR  Pediatrician   HBsAg negative  Support Person  HIV negative  Prenatal Classes  Varicella Immune    GBS  (For PCN allergy, check sensitivities)   BTL Consent     VBAC Consent  Pap  LGSIL repeat in 12 months <24    Hgb Electro      CF      SMA     HepC negative             Preterm labor symptoms and general obstetric precautions including but not limited to vaginal bleeding, contractions, leaking of fluid and fetal movement were reviewed in detail with the patient. Please refer to After Visit Summary for other counseling recommendations.   Return in about 2 weeks (around 09/29/2021) for return OB, GBS.  , CNM  09/15/2021 2:02 PM

## 2021-10-13 ENCOUNTER — Other Ambulatory Visit: Payer: Self-pay | Admitting: Licensed Practical Nurse

## 2021-10-13 ENCOUNTER — Other Ambulatory Visit: Payer: Self-pay

## 2021-10-13 ENCOUNTER — Other Ambulatory Visit: Payer: Medicaid Other

## 2021-10-13 ENCOUNTER — Ambulatory Visit (INDEPENDENT_AMBULATORY_CARE_PROVIDER_SITE_OTHER): Payer: Medicaid Other | Admitting: Licensed Practical Nurse

## 2021-10-13 VITALS — BP 118/72 | Wt 184.0 lb

## 2021-10-13 DIAGNOSIS — O26893 Other specified pregnancy related conditions, third trimester: Secondary | ICD-10-CM

## 2021-10-13 DIAGNOSIS — Z131 Encounter for screening for diabetes mellitus: Secondary | ICD-10-CM

## 2021-10-13 DIAGNOSIS — Z3403 Encounter for supervision of normal first pregnancy, third trimester: Secondary | ICD-10-CM

## 2021-10-13 DIAGNOSIS — Z3402 Encounter for supervision of normal first pregnancy, second trimester: Secondary | ICD-10-CM

## 2021-10-13 DIAGNOSIS — Z3A27 27 weeks gestation of pregnancy: Secondary | ICD-10-CM

## 2021-10-13 DIAGNOSIS — Z6791 Unspecified blood type, Rh negative: Secondary | ICD-10-CM | POA: Diagnosis not present

## 2021-10-13 DIAGNOSIS — Z3490 Encounter for supervision of normal pregnancy, unspecified, unspecified trimester: Secondary | ICD-10-CM

## 2021-10-13 LAB — POCT URINALYSIS DIPSTICK OB
Glucose, UA: NEGATIVE
POC,PROTEIN,UA: NEGATIVE

## 2021-10-13 MED ORDER — RHO D IMMUNE GLOBULIN 1500 UNIT/2ML IJ SOSY
300.0000 ug | PREFILLED_SYRINGE | Freq: Once | INTRAMUSCULAR | Status: AC
  Administered 2021-10-13: 11:00:00 300 ug via INTRAMUSCULAR

## 2021-10-13 NOTE — Progress Notes (Signed)
°  Routine Prenatal Care Visit  Subjective  Hannah Mcgrath is a 20 y.o. G1P0000 at [redacted]w[redacted]d being seen today for ongoing prenatal care.  She is currently monitored for the following issues for this low-risk pregnancy and has Dysmenorrhea and Supervision of normal pregnancy on their problem list.  ----------------------------------------------------------------------------------- Patient reports doing well.  Reviewed weight gain, eats 2 meals a day.  Encouraged to increase physical activity.  Encouraged to sign up for CBE with partner, she desires him to be fully involved in the birth process.   .  .   Pincus Large Fluid denies.  ----------------------------------------------------------------------------------- The following portions of the patient's history were reviewed and updated as appropriate: allergies, current medications, past family history, past medical history, past social history, past surgical history and problem list. Problem list updated.  Objective  Blood pressure 118/72, weight 184 lb (83.5 kg), last menstrual period 04/02/2021. Pregravid weight 140 lb (63.5 kg) Total Weight Gain 44 lb (20 kg) Urinalysis: Urine Protein Negative  Urine Glucose Negative  Fetal Status: Fetal Heart Rate (bpm): 140 Fundal Height: 28 cm Movement: Present     General:  Alert, oriented and cooperative. Patient is in no acute distress.  Skin: Skin is warm and dry. No rash noted.   Cardiovascular: Normal heart rate noted  Respiratory: Normal respiratory effort, no problems with respiration noted  Abdomen: Soft, gravid, appropriate for gestational age.       Pelvic:  Cervical exam deferred        Extremities: Normal range of motion.     Mental Status: Normal mood and affect. Normal behavior. Normal judgment and thought content.   Assessment   20 y.o. G1P0000 at [redacted]w[redacted]d by  01/07/2022, by Last Menstrual Period presenting for routine prenatal visit  Plan   pregnancy 1 Problems (from 06/01/21 to present)      Problem Noted Resolved   Supervision of normal pregnancy 06/01/2021 by Vena Austria, MD No   Overview Addendum 10/13/2021 10:13 AM by Ellwood Sayers, CNM     Nursing Staff Provider  Office Location  [facility] Dating  LMP = [redacted]w[redacted]d [place]  Language  English Anatomy US  Normal, norm fu   Flu Vaccine   Genetic Screen  NIPS:   TDaP vaccine    Hgb A1C or  GTT Early: N/A Third trimester :   Covid    LAB RESULTS   Rhogam  12/28 Blood Type O negative  Feeding Plan breast Antibody negative  Contraception POP Rubella Immune  Circumcision NA RPR NR  Pediatrician   HBsAg negative  Support Person Courian HIV negative  Prenatal Classes Encouraged  Varicella Immune    GBS  (For PCN allergy, check sensitivities)   BTL Consent     VBAC Consent  Pap  LGSIL repeat in 12 months <24    Hgb Electro      CF      SMA     HepC negative              Preterm labor symptoms and general obstetric precautions including but not limited to vaginal bleeding, contractions, leaking of fluid and fetal movement were reviewed in detail with the patient. Please refer to After Visit Summary for other counseling recommendations.   28 wk labs and Rhogam today   Carie Caddy, CNM  [facility]  10/13/21  10:15 AM

## 2021-10-13 NOTE — Progress Notes (Signed)
ROB - 1hr gtt, no concerns. RM 4 

## 2021-10-15 ENCOUNTER — Encounter: Payer: Self-pay | Admitting: Licensed Practical Nurse

## 2021-10-15 LAB — RPR, QUANT+TP ABS (REFLEX)
Rapid Plasma Reagin, Quant: 1:1 {titer} — ABNORMAL HIGH
T Pallidum Abs: NONREACTIVE

## 2021-10-15 LAB — 28 WEEKS RH-PANEL
Antibody Screen: NEGATIVE
Basophils Absolute: 0 10*3/uL (ref 0.0–0.2)
Basos: 0 %
EOS (ABSOLUTE): 0.1 10*3/uL (ref 0.0–0.4)
Eos: 1 %
Gestational Diabetes Screen: 99 mg/dL (ref 70–139)
HIV Screen 4th Generation wRfx: NONREACTIVE
Hematocrit: 30.1 % — ABNORMAL LOW (ref 34.0–46.6)
Hemoglobin: 10.3 g/dL — ABNORMAL LOW (ref 11.1–15.9)
Immature Grans (Abs): 0 10*3/uL (ref 0.0–0.1)
Immature Granulocytes: 0 %
Lymphocytes Absolute: 2.2 10*3/uL (ref 0.7–3.1)
Lymphs: 21 %
MCH: 32.7 pg (ref 26.6–33.0)
MCHC: 34.2 g/dL (ref 31.5–35.7)
MCV: 96 fL (ref 79–97)
Monocytes Absolute: 0.5 10*3/uL (ref 0.1–0.9)
Monocytes: 5 %
Neutrophils Absolute: 7.5 10*3/uL — ABNORMAL HIGH (ref 1.4–7.0)
Neutrophils: 73 %
Platelets: 233 10*3/uL (ref 150–450)
RBC: 3.15 x10E6/uL — ABNORMAL LOW (ref 3.77–5.28)
RDW: 11.6 % — ABNORMAL LOW (ref 11.7–15.4)
RPR Ser Ql: REACTIVE — AB
WBC: 10.4 10*3/uL (ref 3.4–10.8)

## 2021-10-17 NOTE — L&D Delivery Note (Signed)
° ° °     Delivery Note   Hannah Mcgrath is a 21 y.o. G1P0000 at [redacted]w[redacted]d Estimated Date of Delivery: 01/07/22  PRE-OPERATIVE DIAGNOSIS:  1) [redacted]w[redacted]d pregnancy.    POST-OPERATIVE DIAGNOSIS:  1) [redacted]w[redacted]d pregnancy s/p Vaginal, Spontaneous    Delivery Type: Vaginal, Spontaneous    Delivery Anesthesia: Epidural   Labor Complications:  Preterm delivery    ESTIMATED BLOOD LOSS: 200 ml    FINDINGS:   1) female infant, Apgar scores of 8   at 1 minute and 9   at 5 minutes and a birthweight pending, infant remains skin to skin.   2) Nuchal cord: no  SPECIMENS:   PLACENTA:   Appearance: Intact , 3 vessel cord, cord blood collected    Removal: Spontaneous      Disposition:  iper protocol  DISPOSITION:  Infant to left in stable condition in the delivery room, with L&D personnel and mother,  NARRATIVE SUMMARY: Labor course:  Ms. Hannah Mcgrath is a G1P0000 at [redacted]w[redacted]d who presented for labor management.  She progressed well in labor without pitocin.  She received the appropriate epidural anesthesia and proceeded to complete dilation. She evidenced good maternal expulsive effort during the second stage. She went on to deliver a viable female infant "Hannah Mcgrath".  The placenta delivered without problems and was noted to be complete. A perineal and vaginal examination was performed.  Episiotomy/Lacerations: , bilateral periurethral abrasions, perineal abrasion, hemostatic not in need of repair. The patient tolerated this well.  Philip Aspen, CNM  12/04/2021 5:04 AM

## 2021-10-27 ENCOUNTER — Other Ambulatory Visit: Payer: Self-pay

## 2021-10-27 ENCOUNTER — Encounter: Payer: Self-pay | Admitting: Licensed Practical Nurse

## 2021-10-27 ENCOUNTER — Ambulatory Visit (INDEPENDENT_AMBULATORY_CARE_PROVIDER_SITE_OTHER): Payer: Medicaid Other | Admitting: Licensed Practical Nurse

## 2021-10-27 VITALS — BP 130/82 | Wt 189.0 lb

## 2021-10-27 DIAGNOSIS — Z3400 Encounter for supervision of normal first pregnancy, unspecified trimester: Secondary | ICD-10-CM

## 2021-10-27 DIAGNOSIS — Z3A29 29 weeks gestation of pregnancy: Secondary | ICD-10-CM

## 2021-10-27 DIAGNOSIS — Z23 Encounter for immunization: Secondary | ICD-10-CM

## 2021-10-27 DIAGNOSIS — Z113 Encounter for screening for infections with a predominantly sexual mode of transmission: Secondary | ICD-10-CM

## 2021-10-27 DIAGNOSIS — F419 Anxiety disorder, unspecified: Secondary | ICD-10-CM | POA: Insufficient documentation

## 2021-10-27 LAB — POCT URINALYSIS DIPSTICK OB
Glucose, UA: NEGATIVE
POC,PROTEIN,UA: NEGATIVE

## 2021-10-27 NOTE — Progress Notes (Signed)
Routine Prenatal Care Visit  Subjective  Hannah Mcgrath is a 21 y.o. G1P0000 at [redacted]w[redacted]d being seen today for ongoing prenatal care.  She is currently monitored for the following issues for this low-risk pregnancy and has Dysmenorrhea; Supervision of normal pregnancy; and Anxiety on their problem list.  ----------------------------------------------------------------------------------- Patient reports admits to having a lot of anxiety recently, worries about making sure everything is done before the baby comes etc.  Reminded that she has a supportive mother and partner that are capable of helping out.  She has has never seen anyone regarding depression/anxiety "because I just don't want to know" and doe snot like opening up to people, she feels she sometimes is depressed, but has struggled with Anxiety more. She uses deep breathing in the moment. She has spoken with her partner about her concerns. Rec establishing a relationship with a therapist prior to birth.  Has had a decreased appetite, reassured this is normal, rec frequent small meals/snacks  .  .   . Leaking Fluid denies.  ----------------------------------------------------------------------------------- The following portions of the patient's history were reviewed and updated as appropriate: allergies, current medications, past family history, past medical history, past social history, past surgical history and problem list. Problem list updated.  Objective  Blood pressure 130/82, weight 189 lb (85.7 kg), last menstrual period 04/02/2021. Pregravid weight 140 lb (63.5 kg) Total Weight Gain 49 lb (22.2 kg) Urinalysis: Urine Protein Negative  Urine Glucose Negative  Fetal Status: Fetal Heart Rate (bpm): 140 Fundal Height: 29 cm Movement: Present     General:  Alert, oriented and cooperative. Patient is in no acute distress.  Skin: Skin is warm and dry. No rash noted.   Cardiovascular: Normal heart rate noted  Respiratory: Normal  respiratory effort, no problems with respiration noted  Abdomen: Soft, gravid, appropriate for gestational age.       Pelvic:  Cervical exam deferred        Extremities: Normal range of motion.     Mental Status: Normal mood and affect. Normal behavior. Normal judgment and thought content.   Assessment   20 y.o. G1P0000 at [redacted]w[redacted]d by  01/07/2022, by Last Menstrual Period presenting for routine prenatal visit  Plan   pregnancy 1 Problems (from 06/01/21 to present)     Problem Noted Resolved   Anxiety 10/27/2021 by Ellwood Sayers, CNM No   Supervision of normal pregnancy 06/01/2021 by Vena Austria, MD No   Overview Addendum 10/27/2021  3:05 PM by Ellwood Sayers, CNM     Nursing Staff Provider  Office Location  [facility] Dating  LMP = [redacted]w[redacted]d [place]  Language  English Anatomy US  Normal, norm fu   Flu Vaccine  declined Genetic Screen  NIPS:   TDaP vaccine   10/27/20 Hgb A1C or  GTT Early: N/A Third trimester :   Covid    LAB RESULTS   Rhogam  12/28 Blood Type O negative  Feeding Plan breast Antibody negative  Contraception POP Rubella Immune  Circumcision NA RPR NR  Pediatrician   HBsAg negative  Support Person Courian HIV negative  Prenatal Classes Encouraged  Varicella Immune    GBS  (For PCN allergy, check sensitivities)   BTL Consent     VBAC Consent  Pap  LGSIL repeat in 12 months <24    Hgb Electro      CF      SMA     HepC negative  Preterm labor symptoms and general obstetric precautions including but not limited to vaginal bleeding, contractions, leaking of fluid and fetal movement were reviewed in detail with the patient. Please refer to After Visit Summary for other counseling recommendations.   TDAP given  RPR repeated, most likely previous testing false negative   Return in about 2 weeks (around 11/10/2021) for ROB.  Carie Caddy, CNM  [facility], [facility]  10/27/21  3:11 PM

## 2021-10-27 NOTE — Patient Instructions (Signed)
  [facility] Same Day Access Hours:  Monday, Wednesday and Friday, 8am - 3pm Walk-In Crisis Hours: 7 days/wk, 8am - 8pm [address], [place], [place] [address] [phone]  [facility] [phone]  Mobile Crisis Unit [phone]   Therapists/Counselors/Psychologists  [facility] [address] [place], [place] [address] [phone]  Karen Canada, LPC  & Michelle Van Horton    [phone]        [address]       [place], [place] [address]        Gary Bailey, CSW [phone] [address] [place], [place] [address]  Julia Tabor, LPC        [phone]        [address]      [place], [place] [address]        [place], [place] [phone] [address] [place], [place] [address]   Joanna Warren, LMFT       [phone]        [address]       [place], [place] [address]        Tina Thompson [phone] [address] [place], [place] [address]  Amanda Miller        [phone]        [address]       [place], [place] [address]        Bart McCormick [phone] [address] [place], [place] [address]  Cristin Saffo, PsyD       [phone]        [address]       [place], [place] [address]        Cheryl Lawson, LPC [phone] [address]  [place], [place] [address]   Courtney Jones        [phone]        [address]      [place], [place] [address]         Laura Ellington [facility] [phone] [email or web address]   Sation Konchella       [phone]        [address]       ,  [address]        Carmen Bork [facility] [phone] [email or web address]   

## 2021-10-27 NOTE — Progress Notes (Signed)
ROB - no concerns. RM 5 

## 2021-10-29 ENCOUNTER — Encounter: Payer: Self-pay | Admitting: Licensed Practical Nurse

## 2021-10-29 LAB — RPR: RPR Ser Ql: REACTIVE — AB

## 2021-10-29 LAB — RPR, QUANT+TP ABS (REFLEX)
Rapid Plasma Reagin, Quant: 1:1 {titer} — ABNORMAL HIGH
T Pallidum Abs: NONREACTIVE

## 2021-11-10 ENCOUNTER — Ambulatory Visit (INDEPENDENT_AMBULATORY_CARE_PROVIDER_SITE_OTHER): Payer: Medicaid Other | Admitting: Advanced Practice Midwife

## 2021-11-10 ENCOUNTER — Other Ambulatory Visit: Payer: Self-pay

## 2021-11-10 ENCOUNTER — Encounter: Payer: Self-pay | Admitting: Advanced Practice Midwife

## 2021-11-10 VITALS — BP 120/80 | Wt 191.0 lb

## 2021-11-10 DIAGNOSIS — Z3A31 31 weeks gestation of pregnancy: Secondary | ICD-10-CM

## 2021-11-10 DIAGNOSIS — Z3403 Encounter for supervision of normal first pregnancy, third trimester: Secondary | ICD-10-CM

## 2021-11-10 LAB — POCT URINALYSIS DIPSTICK OB
Glucose, UA: NEGATIVE
POC,PROTEIN,UA: NEGATIVE

## 2021-11-10 NOTE — Progress Notes (Signed)
Routine Prenatal Care Visit  Subjective  Hannah Mcgrath is a 21 y.o. G1P0000 at [redacted]w[redacted]d being seen today for ongoing prenatal care.  She is currently monitored for the following issues for this low-risk pregnancy and has Dysmenorrhea; Supervision of normal pregnancy; and Anxiety on their problem list.  ----------------------------------------------------------------------------------- Patient reports no complaints.   Contractions: Not present. Vag. Bleeding: None.  Movement: Present. Leaking Fluid denies.  ----------------------------------------------------------------------------------- The following portions of the patient's history were reviewed and updated as appropriate: allergies, current medications, past family history, past medical history, past social history, past surgical history and problem list. Problem list updated.  Objective  Blood pressure 120/80, weight 191 lb (86.6 kg), last menstrual period 04/02/2021. Pregravid weight 140 lb (63.5 kg) Total Weight Gain 51 lb (23.1 kg) Urinalysis: Urine Protein    Urine Glucose    Fetal Status: Fetal Heart Rate (bpm): 138 Fundal Height: 31 cm Movement: Present     General:  Alert, oriented and cooperative. Patient is in no acute distress.  Skin: Skin is warm and dry. No rash noted.   Cardiovascular: Normal heart rate noted  Respiratory: Normal respiratory effort, no problems with respiration noted  Abdomen: Soft, gravid, appropriate for gestational age. Pain/Pressure: Absent     Pelvic:  Cervical exam deferred        Extremities: Normal range of motion.  Edema: None  Mental Status: Normal mood and affect. Normal behavior. Normal judgment and thought content.   Assessment   20 y.o. G1P0000 at [redacted]w[redacted]d by  01/07/2022, by Last Menstrual Period presenting for routine prenatal visit  Plan   pregnancy 1 Problems (from 06/01/21 to present)    Problem Noted Resolved   Anxiety 10/27/2021 by Ellwood Sayers, CNM No   Supervision of  normal pregnancy 06/01/2021 by Vena Austria, MD No   Overview Addendum 10/27/2021  3:05 PM by Ellwood Sayers, CNM     Nursing Staff Provider  Office Location  [facility] Dating  LMP = [redacted]w[redacted]d [place]  Language  English Anatomy US  Normal, norm fu   Flu Vaccine  declined Genetic Screen  NIPS:   TDaP vaccine   10/27/20 Hgb A1C or  GTT Early: N/A Third trimester :   Covid    LAB RESULTS   Rhogam  12/28 Blood Type O negative  Feeding Plan breast Antibody negative  Contraception POP Rubella Immune  Circumcision NA RPR NR  Pediatrician   HBsAg negative  Support Person Courian HIV negative  Prenatal Classes Encouraged  Varicella Immune    GBS  (For PCN allergy, check sensitivities)   BTL Consent     VBAC Consent  Pap  LGSIL repeat in 12 months <24    Hgb Electro      CF      SMA     HepC negative             Preterm labor symptoms and general obstetric precautions including but not limited to vaginal bleeding, contractions, leaking of fluid and fetal movement were reviewed in detail with the patient. Please refer to After Visit Summary for other counseling recommendations.   Return in about 2 weeks (around 11/24/2021) for rob.  Tresea Mall, CNM 11/10/2021 9:34 AM

## 2021-11-10 NOTE — Patient Instructions (Signed)
Vaginal Delivery ?Vaginal delivery means that you give birth by pushing your baby out of your birth canal (vagina). Your health care team will help you before, during, and after vaginal delivery. ?Birth experiences are unique for every woman and every pregnancy, and birth experiences vary depending on where you choose to give birth. ?What are the risks and benefits? ?Generally, this is safe. However, problems may occur, including: ?Bleeding. ?Infection. ?Damage to other structures such as vaginal tearing. ?Allergic reactions to medicines. ?Despite the risks, benefits of vaginal delivery include less risk of bleeding and infection and a shorter recovery time compared to a Cesarean delivery. Cesarean delivery, or C-section, is the surgical delivery of a baby. ?What happens when I arrive at the birth center or hospital? ?Once you are in labor and have been admitted into the hospital or birth center, your health care team may: ?Review your pregnancy history and any concerns that you have. ?Talk with you about your birth plan and discuss pain control options. ?Check your blood pressure, breathing, and heartbeat. ?Assess your baby's heartbeat. ?Monitor your uterus for contractions. ?Check whether your bag of water (amniotic sac) has broken (ruptured). ?Insert an IV into one of your veins. This may be used to give you fluids and medicines. ?Monitoring ?Your health care team may assess your contractions (uterine monitoring) and your baby's heart rate (fetal monitoring). You may need to be monitored: ?Often, but not continuously (intermittently). ?All the time or for long periods at a time (continuously). Continuous monitoring may be needed if: ?You are taking certain medicines, such as medicine to relieve pain or make your contractions stronger. ?You have pregnancy or labor complications. ?Monitoring may be done by: ?Placing a special stethoscope or a handheld monitoring device on your abdomen to check your baby's heartbeat  and to check for contractions. ?Placing monitors on your abdomen (external monitors) to record your baby's heartbeat and the frequency and length of contractions. ?Placing monitors inside your uterus through your vagina (internal monitors) to record your baby's heartbeat and the frequency, length, and strength of your contractions. Depending on the type of monitor, it may remain in your uterus or on your baby's head until birth. ?Telemetry. This is a type of continuous monitoring that can be done with external or internal monitors. Instead of having to stay in bed, you are able to move around. ?Physical exam ?Your health care team may perform frequent physical exams. This may include: ?Checking how and where your baby is positioned in your uterus. ?Checking your cervix to determine: ?Whether it is thinning out (effacing). ?Whether it is opening up (dilating). ?What happens during labor and delivery? ?Normal labor and delivery is divided into the following three stages: ?Stage 1 ?This is the longest stage of labor. ?Throughout this stage, you will feel contractions. Contractions generally feel mild, infrequent, and irregular at first. They get stronger, more frequent, and more regular as you move through this stage. You may have contractions about every 2-3 minutes. ?This stage ends when your cervix is completely dilated to 4 inches (10 cm) and completely effaced. ?Stage 2 ?This stage starts once your cervix is completely effaced and dilated and lasts until the delivery of your baby. ?This is the stage where you will feel an urge to push your baby out of your vagina. ?You may feel stretching and burning pain, especially when the widest part of your baby's head passes through the vaginal opening (crowning). ?Once your baby is delivered, the umbilical cord will be clamped and   cut. Timing of cutting the cord will depend on your wishes, your baby's health, and your health care provider's practices. ?Your baby will be  placed on your bare chest (skin-to-skin contact) in an upright position and covered with a warm blanket. If you are choosing to breastfeed, watch your baby for feeding cues, like rooting or sucking, and help the baby to your breast for his or her first feeding. ?Stage 3 ?This stage starts immediately after the birth of your baby and ends after you deliver the placenta. ?This stage may take anywhere from 5 to 30 minutes. ?After your baby has been delivered, you will feel contractions as your body expels the placenta. These contractions also help your uterus get smaller and reduce bleeding. ?What can I expect after labor and delivery? ?After labor is over, you and your baby will be assessed closely until you are ready to go home. Your health care team will teach you how to care for yourself and your baby. ?You and your baby may be encouraged to stay in the same room (rooming in) during your hospital stay. This will help promote early bonding and successful breastfeeding. ?Your uterus will be checked and massaged regularly (fundal massage). ?You may continue to receive fluids and medicines through an IV. ?You will have some soreness and pain in your abdomen, vagina, and the area of skin between your vaginal opening and your anus (perineum). ?If an incision was made near your vagina (episiotomy) or if you had some vaginal tearing during delivery, cold compresses may be placed on your episiotomy or your tear. This helps to reduce pain and swelling. ?It is normal to have vaginal bleeding after delivery. Wear a sanitary pad for vaginal bleeding and discharge. ?Summary ?Vaginal delivery means that you will give birth by pushing your baby out of your birth canal (vagina). ?Your health care team will monitor you and your baby throughout the stages of labor. ?After you deliver your baby, your health care team will continue to assess you and your baby to ensure you are both recovering as expected after delivery. ?This  information is not intended to replace advice given to you by your health care provider. Make sure you discuss any questions you have with your health care provider. ?Document Revised: 08/31/2020 Document Reviewed: 08/31/2020 ?Elsevier Patient Education ? 2022 [employer] Inc[employer] ? ?

## 2021-11-10 NOTE — Addendum Note (Signed)
Addended by: Quintella Baton D on: 11/10/2021 09:41 AM   Modules accepted: Orders

## 2021-11-24 ENCOUNTER — Ambulatory Visit (INDEPENDENT_AMBULATORY_CARE_PROVIDER_SITE_OTHER): Payer: Medicaid Other | Admitting: Licensed Practical Nurse

## 2021-11-24 ENCOUNTER — Encounter: Payer: Self-pay | Admitting: Licensed Practical Nurse

## 2021-11-24 ENCOUNTER — Other Ambulatory Visit: Payer: Self-pay

## 2021-11-24 VITALS — BP 116/70 | Ht 65.5 in | Wt 193.6 lb

## 2021-11-24 DIAGNOSIS — N898 Other specified noninflammatory disorders of vagina: Secondary | ICD-10-CM

## 2021-11-24 DIAGNOSIS — Z3400 Encounter for supervision of normal first pregnancy, unspecified trimester: Secondary | ICD-10-CM

## 2021-11-24 DIAGNOSIS — Z3A33 33 weeks gestation of pregnancy: Secondary | ICD-10-CM

## 2021-11-24 LAB — POCT URINALYSIS DIPSTICK OB
Glucose, UA: NEGATIVE
POC,PROTEIN,UA: NEGATIVE

## 2021-11-24 NOTE — Progress Notes (Signed)
Routine Prenatal Care Visit  Subjective  Hannah Mcgrath is a 21 y.o. G1P0000 at [redacted]w[redacted]d being seen today for ongoing prenatal care.  She is currently monitored for the following issues for this low-risk pregnancy and has Dysmenorrhea; Supervision of normal pregnancy; and Anxiety on their problem list.  ----------------------------------------------------------------------------------- Patient reports Has noticed a large amount of green discharge, denies odor, vaginal odor or spotting.  -feels ready for baby, doing a lt of reading, had baby shower.  Contractions: Not present. Vag. Bleeding: None.  Movement: Present. Leaking Fluid denies.  ----------------------------------------------------------------------------------- The following portions of the patient's history were reviewed and updated as appropriate: allergies, current medications, past family history, past medical history, past social history, past surgical history and problem list. Problem list updated.  Objective  Blood pressure 116/70, height 5' 5.5" (1.664 m), weight 193 lb 9.6 oz (87.8 kg), last menstrual period 04/02/2021. Pregravid weight 140 lb (63.5 kg) Total Weight Gain 53 lb 9.6 oz (24.3 kg) Urinalysis: Urine Protein    Urine Glucose    Fetal Status:     Movement: Present     General:  Alert, oriented and cooperative. Patient is in no acute distress.  Skin: Skin is warm and dry. No rash noted.   Cardiovascular: Normal heart rate noted  Respiratory: Normal respiratory effort, no problems with respiration noted  Abdomen: Soft, gravid, appropriate for gestational age. Pain/Pressure: Absent     Pelvic:  Cervical exam deferred        Extremities: Normal range of motion.     Mental Status: Normal mood and affect. Normal behavior. Normal judgment and thought content.   Assessment   20 y.o. G1P0000 at [redacted]w[redacted]d by  01/07/2022, by Last Menstrual Period presenting for routine prenatal visit  Plan   pregnancy 1 Problems (from  06/01/21 to present)     Problem Noted Resolved   Anxiety 10/27/2021 by Ellwood Sayers, CNM No   Supervision of normal pregnancy 06/01/2021 by Vena Austria, MD No   Overview Addendum 10/27/2021  3:05 PM by Ellwood Sayers, CNM     Nursing Staff Provider  Office Location  [facility] Dating  LMP = [redacted]w[redacted]d [place]  Language  English Anatomy US  Normal, norm fu   Flu Vaccine  declined Genetic Screen  NIPS:   TDaP vaccine   10/27/20 Hgb A1C or  GTT Early: N/A Third trimester :   Covid    LAB RESULTS   Rhogam  12/28 Blood Type O negative  Feeding Plan breast Antibody negative  Contraception POP Rubella Immune  Circumcision NA RPR NR  Pediatrician   HBsAg negative  Support Person Courian HIV negative  Prenatal Classes Encouraged  Varicella Immune    GBS  (For PCN allergy, check sensitivities)   BTL Consent     VBAC Consent  Pap  LGSIL repeat in 12 months <24    Hgb Electro      CF      SMA     HepC negative              Preterm labor symptoms and general obstetric precautions including but not limited to vaginal bleeding, contractions, leaking of fluid and fetal movement were reviewed in detail with the patient. Please refer to After Visit Summary for other counseling recommendations.   Return in about 2 weeks (around 12/08/2021) for ROB.   Swab sent for BV   Carie Caddy, CNM  [facility] Pulse, [facility]  11/24/21  2:21 PM

## 2021-11-27 LAB — NUSWAB VAGINITIS PLUS (VG+)
Candida albicans, NAA: NEGATIVE
Candida glabrata, NAA: NEGATIVE
Chlamydia trachomatis, NAA: POSITIVE — AB
Megasphaera 1: HIGH Score — AB
Neisseria gonorrhoeae, NAA: NEGATIVE
Trich vag by NAA: NEGATIVE

## 2021-11-29 ENCOUNTER — Other Ambulatory Visit: Payer: Self-pay | Admitting: Licensed Practical Nurse

## 2021-11-29 DIAGNOSIS — A749 Chlamydial infection, unspecified: Secondary | ICD-10-CM

## 2021-11-29 DIAGNOSIS — O98813 Other maternal infectious and parasitic diseases complicating pregnancy, third trimester: Secondary | ICD-10-CM

## 2021-11-29 MED ORDER — AZITHROMYCIN 500 MG PO TABS: 1000.0000 mg | ORAL_TABLET | Freq: Once | ORAL | 0 refills | Status: AC

## 2021-11-29 NOTE — Progress Notes (Signed)
TC to [place], LVM stating I would reply to her message in [place]. Seen for abnormal discharge, swab positive for chlamydia.  Cristan notified through [place] [place], script sent to pharmacy on file.  Carie Caddy, CNM  [facility] Pulse, [facility]  11/29/21  1:05 PM

## 2021-12-03 ENCOUNTER — Encounter: Payer: Self-pay | Admitting: Obstetrics and Gynecology

## 2021-12-03 ENCOUNTER — Other Ambulatory Visit: Payer: Self-pay

## 2021-12-03 ENCOUNTER — Inpatient Hospital Stay
Admission: EM | Admit: 2021-12-03 | Discharge: 2021-12-06 | DRG: 805 | Disposition: A | Discharge status: Home / Self Care | Payer: Medicaid Other | Attending: Certified Nurse Midwife | Admitting: Certified Nurse Midwife

## 2021-12-03 DIAGNOSIS — O42913 Preterm premature rupture of membranes, unspecified as to length of time between rupture and onset of labor, third trimester: Secondary | ICD-10-CM | POA: Diagnosis present

## 2021-12-03 DIAGNOSIS — Z3A35 35 weeks gestation of pregnancy: Secondary | ICD-10-CM

## 2021-12-03 DIAGNOSIS — Z3490 Encounter for supervision of normal pregnancy, unspecified, unspecified trimester: Secondary | ICD-10-CM

## 2021-12-03 DIAGNOSIS — A749 Chlamydial infection, unspecified: Secondary | ICD-10-CM | POA: Diagnosis not present

## 2021-12-03 DIAGNOSIS — Z20822 Contact with and (suspected) exposure to covid-19: Secondary | ICD-10-CM | POA: Diagnosis present

## 2021-12-03 DIAGNOSIS — O98813 Other maternal infectious and parasitic diseases complicating pregnancy, third trimester: Secondary | ICD-10-CM | POA: Diagnosis not present

## 2021-12-03 DIAGNOSIS — O4290 Premature rupture of membranes, unspecified as to length of time between rupture and onset of labor, unspecified weeks of gestation: Secondary | ICD-10-CM | POA: Diagnosis present

## 2021-12-03 DIAGNOSIS — O26893 Other specified pregnancy related conditions, third trimester: Secondary | ICD-10-CM | POA: Diagnosis present

## 2021-12-03 DIAGNOSIS — O42013 Preterm premature rupture of membranes, onset of labor within 24 hours of rupture, third trimester: Secondary | ICD-10-CM | POA: Diagnosis not present

## 2021-12-03 LAB — RUPTURE OF MEMBRANE (ROM)PLUS: Rom Plus: POSITIVE

## 2021-12-03 MED ORDER — OXYTOCIN-SODIUM CHLORIDE 30-0.9 UT/500ML-% IV SOLN
2.5000 [IU]/h | INTRAVENOUS | Status: DC
  Administered 2021-12-04: 2.5 [IU]/h via INTRAVENOUS
  Filled 2021-12-03: qty 500

## 2021-12-03 MED ORDER — SOD CITRATE-CITRIC ACID 500-334 MG/5ML PO SOLN: 30.0000 mL | ORAL | Status: DC | PRN

## 2021-12-03 MED ORDER — LACTATED RINGERS IV SOLN
500.0000 mL | INTRAVENOUS | Status: DC | PRN
  Administered 2021-12-04: 500 mL via INTRAVENOUS

## 2021-12-03 MED ORDER — BETAMETHASONE SOD PHOS & ACET 6 (3-3) MG/ML IJ SUSP
12.0000 mg | INTRAMUSCULAR | Status: DC
  Administered 2021-12-04: 12 mg via INTRAMUSCULAR
  Filled 2021-12-03: qty 5

## 2021-12-03 MED ORDER — PENICILLIN G POT IN DEXTROSE 60000 UNIT/ML IV SOLN: 3.0000 10*6.[IU] | INTRAVENOUS | Status: DC

## 2021-12-03 MED ORDER — ONDANSETRON HCL 4 MG/2ML IJ SOLN
4.0000 mg | Freq: Four times a day (QID) | INTRAMUSCULAR | Status: DC | PRN
  Administered 2021-12-04: 4 mg via INTRAVENOUS
  Filled 2021-12-03: qty 2

## 2021-12-03 MED ORDER — LIDOCAINE HCL (PF) 1 % IJ SOLN
30.0000 mL | INTRAMUSCULAR | Status: DC | PRN
Start: 2021-12-03 — End: 2021-12-04

## 2021-12-03 MED ORDER — LACTATED RINGERS IV SOLN: INTRAVENOUS | Status: DC

## 2021-12-03 MED ORDER — OXYTOCIN BOLUS FROM INFUSION
333.0000 mL | Freq: Once | INTRAVENOUS | Status: AC
  Administered 2021-12-04: 333 mL via INTRAVENOUS

## 2021-12-03 MED ORDER — SODIUM CHLORIDE 0.9 % IV SOLN
5.0000 10*6.[IU] | Freq: Once | INTRAVENOUS | Status: AC
  Administered 2021-12-04: 5 10*6.[IU] via INTRAVENOUS
  Filled 2021-12-03: qty 5

## 2021-12-03 MED ORDER — ACETAMINOPHEN 325 MG PO TABS
650.0000 mg | ORAL_TABLET | ORAL | Status: DC | PRN
  Administered 2021-12-04: 650 mg via ORAL
  Filled 2021-12-03: qty 2

## 2021-12-03 NOTE — Progress Notes (Signed)
Fern test performed. No ferning noted.

## 2021-12-03 NOTE — H&P (Signed)
Hannah Mcgrath is an 21 y.o. female.   Chief Complaint: Leakage of fluid HPI: Patient presented to L&D with complaints of leakage of fluid which started at 6 pm. She has low back pain and vomited in triage.  She recently had a chlamydia infection which she and her partner took treatment for this week although they have continued to be sexually active.   pregnancy 1 Problems (from 06/01/21 to present)     Problem Noted Resolved   Anxiety 10/27/2021 by Allen Derry, CNM No   Supervision of normal pregnancy 06/01/2021 by Malachy Mood, MD No   Overview Addendum 10/27/2021  3:05 PM by Allen Derry, CNM     Nursing Staff Provider  Office Location  [facility] Dating  LMP = [redacted]w[redacted]d [place]  Language  English Anatomy US  Normal, norm fu   Flu Vaccine  declined Genetic Screen  NIPS:   TDaP vaccine   10/27/20 Hgb A1C or  GTT Early: N/A Third trimester : 99  Covid    LAB RESULTS   Rhogam  12/28 Blood Type O negative  Feeding Plan breast Antibody negative  Contraception POP Rubella Immune  Circumcision NA RPR NR  Pediatrician   HBsAg negative  Support Person Courian HIV negative  Prenatal Classes Encouraged  Varicella Immune    GBS  (For PCN allergy, check sensitivities)   BTL Consent     VBAC Consent  Pap  LGSIL repeat in 12 months <24    Hgb Electro      CF      SMA     HepC negative             Past Medical History:  Diagnosis Date   Dysmenorrhea    Human papilloma virus (HPV) type 9 vaccine administered    gardisil series complete   Ovarian cyst 2017   RT    Past Surgical History:  Procedure Laterality Date   NO PAST SURGERIES     WISDOM TOOTH EXTRACTION Bilateral     Family History  Problem Relation Age of Onset   Non-Hodgkin's lymphoma Father 40   Social History:  reports that she has never smoked. She has never used smokeless tobacco. She reports that she does not currently use drugs after having used the following drugs: Marijuana. She reports that she  does not drink alcohol.  Allergies: No Known Allergies  Medications Prior to Admission  Medication Sig Dispense Refill   Prenat-Fe Poly-Methfol-FA-DHA (VITAFOL FE+) 90-0.6-0.4-200 MG CAPS Take 1 tablet by mouth daily. 30 capsule 11   metroNIDAZOLE (FLAGYL) 500 MG tablet SMARTSIG:1 Tablet(s) By Mouth Every 12 Hours (Patient not taking: Reported on 11/24/2021)     ondansetron (ZOFRAN ODT) 4 MG disintegrating tablet Take 1 tablet (4 mg total) by mouth every 8 (eight) hours as needed for nausea or vomiting. 12 tablet 0    Results for orders placed or performed during the hospital encounter of 12/03/21 (from the past 48 hour(s))  ROM Plus ([facility] only)     Status: None   Collection Time: 12/03/21 10:00 PM  Result Value Ref Range   Rom Plus POSITIVE     Comment: Performed at [facility], [address] [place], [place] [address]   No results found.  Review of Systems  Constitutional:  Negative for chills and fever.  HENT:  Negative for congestion, hearing loss and sinus pain.   Respiratory:  Negative for cough, shortness of breath and wheezing.   Cardiovascular:  Negative for  chest pain, palpitations and leg swelling.  Gastrointestinal:  Negative for abdominal pain, constipation, diarrhea, nausea and vomiting.  Genitourinary:  Negative for dysuria, flank pain, frequency, hematuria and urgency.  Musculoskeletal:  Negative for back pain.  Skin:  Negative for rash.  Neurological:  Negative for dizziness and headaches.  Psychiatric/Behavioral:  Negative for suicidal ideas. The patient is not nervous/anxious.    Blood pressure 138/72, pulse 89, temperature 98.8 F (37.1 C), temperature source Oral, resp. rate 18, height 5\' 5"  (1.651 m), weight 88.5 kg, last menstrual period 04/02/2021. Physical Exam Vitals and nursing note reviewed.  Constitutional:      Appearance: Normal appearance. She is well-developed.  HENT:     Head: Normocephalic and atraumatic.  Cardiovascular:     Rate  and Rhythm: Normal rate and regular rhythm.  Pulmonary:     Effort: Pulmonary effort is normal.     Breath sounds: Normal breath sounds.  Abdominal:     General: Bowel sounds are normal.     Palpations: Abdomen is soft.  Genitourinary:    Comments: SVE: 4/80/-3 Fetal hair and scalp seen on speculum exam. + pooling and grossly ruptured Musculoskeletal:        General: Normal range of motion.  Skin:    General: Skin is warm and dry.  Neurological:     Mental Status: She is alert and oriented to person, place, and time.  Psychiatric:        Behavior: Behavior normal.        Thought Content: Thought content normal.        Judgment: Judgment normal.    NST: 150 bpm baseline, moderate variability, 15x15 accelerations, no decelerations. Tocometer : every 2-3 minutes  Assessment/Plan 21 y.o. G1P0000 [redacted]w[redacted]d here for  PPROM  Patient contracting regularly with favorable cervix, expectant management of labor, if needed will augment with pitocin.   Normal fetal monitoring per unit policy. Reviewed option for pain management with patient. Patient does desire an epidural.  Typical admission labs. GBS: pending- start treatment with PCN Betamethasone ordered for prematurity    Adrian Prows MD, [place], [facility] 12/03/2021 11:45 PM

## 2021-12-03 NOTE — OB Triage Note (Signed)
Pt is a G1P0 at [redacted]w[redacted]d presenting to l&d triage complaining of LOF. Pt states she felt a trickle around 1800 and then a gush an hour later. She states it was clear and "mucousy." She states the mucous was a bit white. Pt endorses having sex at around 0100 this morning. Denies vaginal bleeding. Denies pain. VSS. +FM.

## 2021-12-04 ENCOUNTER — Encounter: Payer: Self-pay | Admitting: Obstetrics and Gynecology

## 2021-12-04 ENCOUNTER — Inpatient Hospital Stay: Payer: Medicaid Other | Admitting: Anesthesiology

## 2021-12-04 DIAGNOSIS — Z3A35 35 weeks gestation of pregnancy: Secondary | ICD-10-CM

## 2021-12-04 DIAGNOSIS — A749 Chlamydial infection, unspecified: Secondary | ICD-10-CM

## 2021-12-04 DIAGNOSIS — O98813 Other maternal infectious and parasitic diseases complicating pregnancy, third trimester: Secondary | ICD-10-CM

## 2021-12-04 DIAGNOSIS — O42013 Preterm premature rupture of membranes, onset of labor within 24 hours of rupture, third trimester: Secondary | ICD-10-CM

## 2021-12-04 LAB — TYPE AND SCREEN
ABO/RH(D): O NEG
Antibody Screen: POSITIVE

## 2021-12-04 LAB — CBC
HCT: 32.9 % — ABNORMAL LOW (ref 36.0–46.0)
Hemoglobin: 11.2 g/dL — ABNORMAL LOW (ref 12.0–15.0)
MCH: 31.3 pg (ref 26.0–34.0)
MCHC: 34 g/dL (ref 30.0–36.0)
MCV: 91.9 fL (ref 80.0–100.0)
Platelets: 239 10*3/uL (ref 150–400)
RBC: 3.58 MIL/uL — ABNORMAL LOW (ref 3.87–5.11)
RDW: 12.4 % (ref 11.5–15.5)
WBC: 17 10*3/uL — ABNORMAL HIGH (ref 4.0–10.5)
nRBC: 0 % (ref 0.0–0.2)

## 2021-12-04 LAB — RESP PANEL BY RT-PCR (FLU A&B, COVID) ARPGX2
Influenza A by PCR: NEGATIVE
Influenza B by PCR: NEGATIVE
SARS Coronavirus 2 by RT PCR: NEGATIVE

## 2021-12-04 LAB — RPR
RPR Ser Ql: REACTIVE — AB
RPR Titer: 1:1 {titer}

## 2021-12-04 LAB — RAPID HIV SCREEN (HIV 1/2 AB+AG)
HIV 1/2 Antibodies: NONREACTIVE
HIV-1 P24 Antigen - HIV24: NONREACTIVE

## 2021-12-04 LAB — GROUP B STREP BY PCR: Group B strep by PCR: NEGATIVE

## 2021-12-04 MED ORDER — DIPHENHYDRAMINE HCL 50 MG/ML IJ SOLN: 12.5000 mg | INTRAMUSCULAR | Status: DC | PRN

## 2021-12-04 MED ORDER — FENTANYL-BUPIVACAINE-NACL 0.5-0.125-0.9 MG/250ML-% EP SOLN
EPIDURAL | Status: AC
  Filled 2021-12-04: qty 250

## 2021-12-04 MED ORDER — EPHEDRINE 5 MG/ML INJ
10.0000 mg | INTRAVENOUS | Status: DC | PRN
  Filled 2021-12-04: qty 2

## 2021-12-04 MED ORDER — MISOPROSTOL 200 MCG PO TABS
ORAL_TABLET | ORAL | Status: AC
  Filled 2021-12-04: qty 4

## 2021-12-04 MED ORDER — BUPIVACAINE HCL (PF) 0.25 % IJ SOLN
INTRAMUSCULAR | Status: DC | PRN
  Administered 2021-12-04 (×2): 5 mL via EPIDURAL

## 2021-12-04 MED ORDER — IBUPROFEN 600 MG PO TABS
600.0000 mg | ORAL_TABLET | Freq: Four times a day (QID) | ORAL | Status: DC
  Administered 2021-12-04 – 2021-12-06 (×10): 600 mg via ORAL
  Filled 2021-12-04 (×10): qty 1

## 2021-12-04 MED ORDER — ONDANSETRON HCL 4 MG/2ML IJ SOLN: 4.0000 mg | INTRAMUSCULAR | Status: DC | PRN

## 2021-12-04 MED ORDER — FERROUS SULFATE 325 (65 FE) MG PO TABS
325.0000 mg | ORAL_TABLET | Freq: Every day | ORAL | Status: DC
  Administered 2021-12-04 – 2021-12-06 (×3): 325 mg via ORAL
  Filled 2021-12-04 (×3): qty 1

## 2021-12-04 MED ORDER — DOCUSATE SODIUM 100 MG PO CAPS
100.0000 mg | ORAL_CAPSULE | Freq: Two times a day (BID) | ORAL | Status: DC
  Administered 2021-12-05 – 2021-12-06 (×2): 100 mg via ORAL
  Filled 2021-12-04 (×2): qty 1

## 2021-12-04 MED ORDER — OXYTOCIN 10 UNIT/ML IJ SOLN
INTRAMUSCULAR | Status: AC
  Filled 2021-12-04: qty 2

## 2021-12-04 MED ORDER — LIDOCAINE-EPINEPHRINE (PF) 1.5 %-1:200000 IJ SOLN
INTRAMUSCULAR | Status: DC | PRN
  Administered 2021-12-04: 3 mL via EPIDURAL

## 2021-12-04 MED ORDER — PRENATAL MULTIVITAMIN CH
1.0000 | ORAL_TABLET | Freq: Every day | ORAL | Status: DC
  Administered 2021-12-04 – 2021-12-06 (×3): 1 via ORAL
  Filled 2021-12-04 (×3): qty 1

## 2021-12-04 MED ORDER — BENZOCAINE-MENTHOL 20-0.5 % EX AERO
1.0000 "application " | INHALATION_SPRAY | CUTANEOUS | Status: DC | PRN
  Administered 2021-12-04 – 2021-12-05 (×2): 1 via TOPICAL
  Filled 2021-12-04 (×2): qty 56

## 2021-12-04 MED ORDER — LIDOCAINE HCL (PF) 1 % IJ SOLN
INTRAMUSCULAR | Status: DC
Start: 2021-12-04 — End: 2021-12-04
  Filled 2021-12-04: qty 30

## 2021-12-04 MED ORDER — LIDOCAINE HCL (PF) 1 % IJ SOLN
INTRAMUSCULAR | Status: DC | PRN
  Administered 2021-12-04: 3 mL

## 2021-12-04 MED ORDER — WITCH HAZEL-GLYCERIN EX PADS
1.0000 "application " | MEDICATED_PAD | CUTANEOUS | Status: DC | PRN
  Administered 2021-12-04 – 2021-12-05 (×2): 1 via TOPICAL
  Filled 2021-12-04 (×2): qty 100

## 2021-12-04 MED ORDER — FENTANYL-BUPIVACAINE-NACL 0.5-0.125-0.9 MG/250ML-% EP SOLN
12.0000 mL/h | EPIDURAL | Status: DC | PRN
  Administered 2021-12-04: 12 mL/h via EPIDURAL

## 2021-12-04 MED ORDER — AMMONIA AROMATIC IN INHA
RESPIRATORY_TRACT | Status: AC
  Filled 2021-12-04: qty 10

## 2021-12-04 MED ORDER — DIBUCAINE (PERIANAL) 1 % EX OINT
1.0000 "application " | TOPICAL_OINTMENT | CUTANEOUS | Status: DC | PRN
  Filled 2021-12-04: qty 28

## 2021-12-04 MED ORDER — SIMETHICONE 80 MG PO CHEW
80.0000 mg | CHEWABLE_TABLET | ORAL | Status: DC | PRN
  Administered 2021-12-04: 80 mg via ORAL
  Filled 2021-12-04: qty 1

## 2021-12-04 MED ORDER — TETANUS-DIPHTH-ACELL PERTUSSIS 5-2.5-18.5 LF-MCG/0.5 IM SUSY
0.5000 mL | PREFILLED_SYRINGE | INTRAMUSCULAR | Status: DC | PRN
  Filled 2021-12-04: qty 0.5

## 2021-12-04 MED ORDER — OXYCODONE-ACETAMINOPHEN 5-325 MG PO TABS: 1.0000 | ORAL_TABLET | ORAL | Status: DC | PRN

## 2021-12-04 MED ORDER — COCONUT OIL OIL
1.0000 "application " | TOPICAL_OIL | Status: DC | PRN
  Administered 2021-12-05: 1 via TOPICAL
  Filled 2021-12-04 (×2): qty 120

## 2021-12-04 MED ORDER — SENNOSIDES-DOCUSATE SODIUM 8.6-50 MG PO TABS
2.0000 | ORAL_TABLET | ORAL | Status: DC
  Administered 2021-12-04 – 2021-12-05 (×2): 2 via ORAL
  Filled 2021-12-04 (×2): qty 2

## 2021-12-04 MED ORDER — OXYCODONE-ACETAMINOPHEN 5-325 MG PO TABS: 2.0000 | ORAL_TABLET | ORAL | Status: DC | PRN

## 2021-12-04 MED ORDER — ACETAMINOPHEN 325 MG PO TABS: 650.0000 mg | ORAL_TABLET | ORAL | Status: DC | PRN

## 2021-12-04 MED ORDER — BUTORPHANOL TARTRATE 1 MG/ML IJ SOLN
0.5000 mg | INTRAMUSCULAR | Status: DC | PRN
  Administered 2021-12-04: 0.5 mg via INTRAVENOUS

## 2021-12-04 MED ORDER — LACTATED RINGERS IV SOLN: 500.0000 mL | Freq: Once | INTRAVENOUS | Status: DC

## 2021-12-04 MED ORDER — BUTORPHANOL TARTRATE 1 MG/ML IJ SOLN
INTRAMUSCULAR | Status: AC
  Filled 2021-12-04: qty 1

## 2021-12-04 MED ORDER — PHENYLEPHRINE 40 MCG/ML (10ML) SYRINGE FOR IV PUSH (FOR BLOOD PRESSURE SUPPORT)
80.0000 ug | PREFILLED_SYRINGE | INTRAVENOUS | Status: DC | PRN
  Filled 2021-12-04: qty 10

## 2021-12-04 MED ORDER — ONDANSETRON HCL 4 MG PO TABS: 4.0000 mg | ORAL_TABLET | ORAL | Status: DC | PRN

## 2021-12-04 NOTE — Anesthesia Preprocedure Evaluation (Deleted)
Anesthesia Evaluation  Patient identified by MRN, date of birth, ID band Patient awake    Reviewed: Allergy & Precautions, NPO status , Patient's Chart, lab work & pertinent test results  Airway Mallampati: III  TM Distance: >3 FB Neck ROM: full    Dental no notable dental hx.    Pulmonary neg pulmonary ROS,    Pulmonary exam normal        Cardiovascular Exercise Tolerance: Good negative cardio ROS Normal cardiovascular exam     Neuro/Psych PSYCHIATRIC DISORDERS Anxiety    GI/Hepatic negative GI ROS,   Endo/Other    Renal/GU   negative genitourinary   Musculoskeletal   Abdominal   Peds  Hematology negative hematology ROS (+)   Anesthesia Other Findings Past Medical History: No date: Dysmenorrhea No date: Human papilloma virus (HPV) type 9 vaccine administered     Comment:  gardisil series complete 2017: Ovarian cyst     Comment:  RT  Past Surgical History: No date: NO PAST SURGERIES No date: WISDOM TOOTH EXTRACTION; Bilateral  BMI    Body Mass Index: 32.45 kg/m      Reproductive/Obstetrics (+) Pregnancy                             Anesthesia Physical Anesthesia Plan  ASA: 2  Anesthesia Plan: Epidural   Post-op Pain Management:    Induction:   PONV Risk Score and Plan:   Airway Management Planned:   Additional Equipment:   Intra-op Plan:   Post-operative Plan:   Informed Consent: I have reviewed the patients History and Physical, chart, labs and discussed the procedure including the risks, benefits and alternatives for the proposed anesthesia with the patient or authorized representative who has indicated his/her understanding and acceptance.       Plan Discussed with: Anesthesiologist  Anesthesia Plan Comments:         Anesthesia Quick Evaluation

## 2021-12-04 NOTE — Anesthesia Procedure Notes (Signed)
Epidural Patient location during procedure: OB Start time: 12/04/2021 2:52 AM End time: 12/04/2021 3:15 AM  Staffing Anesthesiologist: Foye Deer, MD Performed: anesthesiologist   Preanesthetic Checklist Completed: patient identified, IV checked, site marked, risks and benefits discussed, surgical consent, monitors and equipment checked, pre-op evaluation and timeout performed  Epidural Patient position: sitting Prep: ChloraPrep Patient monitoring: heart rate, continuous pulse ox and blood pressure Approach: midline Location: L3-L4 Injection technique: LOR saline  Needle:  Needle type: Tuohy  Needle gauge: 18 G Needle length: 9 cm Needle insertion depth: 8 cm Catheter type: closed end Catheter size: 20 Guage Catheter at skin depth: 12 cm Test dose: negative and 1.5% lidocaine with Epi 1:200 K  Assessment Events: blood not aspirated, injection not painful, no injection resistance and no paresthesia  Additional Notes Reason for block:procedure for pain

## 2021-12-04 NOTE — Anesthesia Preprocedure Evaluation (Signed)
Anesthesia Evaluation  Patient identified by MRN, date of birth, ID band Patient awake    Reviewed: Allergy & Precautions, NPO status , Patient's Chart, lab work & pertinent test results  Airway Mallampati: III  TM Distance: >3 FB Neck ROM: full    Dental no notable dental hx.    Pulmonary neg pulmonary ROS,    Pulmonary exam normal        Cardiovascular Exercise Tolerance: Good negative cardio ROS Normal cardiovascular exam     Neuro/Psych    GI/Hepatic negative GI ROS,   Endo/Other    Renal/GU   negative genitourinary   Musculoskeletal   Abdominal   Peds  Hematology negative hematology ROS (+)   Anesthesia Other Findings Past Medical History: No date: Dysmenorrhea No date: Human papilloma virus (HPV) type 9 vaccine administered     Comment:  gardisil series complete 2017: Ovarian cyst     Comment:  RT  Past Surgical History: No date: NO PAST SURGERIES No date: WISDOM TOOTH EXTRACTION; Bilateral  BMI    Body Mass Index: 32.45 kg/m      Reproductive/Obstetrics (+) Pregnancy                             Anesthesia Physical Anesthesia Plan  ASA: 2  Anesthesia Plan: Epidural   Post-op Pain Management:    Induction:   PONV Risk Score and Plan:   Airway Management Planned:   Additional Equipment:   Intra-op Plan:   Post-operative Plan:   Informed Consent: I have reviewed the patients History and Physical, chart, labs and discussed the procedure including the risks, benefits and alternatives for the proposed anesthesia with the patient or authorized representative who has indicated his/her understanding and acceptance.       Plan Discussed with: Anesthesiologist  Anesthesia Plan Comments:         Anesthesia Quick Evaluation

## 2021-12-05 LAB — CBC
HCT: 28.5 % — ABNORMAL LOW (ref 36.0–46.0)
Hemoglobin: 9.6 g/dL — ABNORMAL LOW (ref 12.0–15.0)
MCH: 31.5 pg (ref 26.0–34.0)
MCHC: 33.7 g/dL (ref 30.0–36.0)
MCV: 93.4 fL (ref 80.0–100.0)
Platelets: 199 10*3/uL (ref 150–400)
RBC: 3.05 MIL/uL — ABNORMAL LOW (ref 3.87–5.11)
RDW: 12.6 % (ref 11.5–15.5)
WBC: 16.3 10*3/uL — ABNORMAL HIGH (ref 4.0–10.5)
nRBC: 0 % (ref 0.0–0.2)

## 2021-12-05 LAB — FETAL SCREEN: Fetal Screen: NEGATIVE

## 2021-12-05 MED ORDER — RHO D IMMUNE GLOBULIN 1500 UNIT/2ML IJ SOSY
300.0000 ug | PREFILLED_SYRINGE | Freq: Once | INTRAMUSCULAR | Status: AC
  Administered 2021-12-05: 300 ug via INTRAVENOUS
  Filled 2021-12-05: qty 2

## 2021-12-05 NOTE — Clinical Social Work Maternal (Signed)
°  CLINICAL SOCIAL WORK MATERNAL/CHILD NOTE  Patient Details  Name: JADAISHA BREEZE MRN: [id number] Date of Birth: 08/18/2001  Date:  12/05/2021  Clinical Social Worker Initiating Note:  Raina Mina Date/Time: Initiated:  12/05/21/      Child's Name:  Marylu Lund   Biological Parents:  Mother, Father   Need for Interpreter:  None   Reason for Referral:  Current Substance Use/Substance Use During Pregnancy     Address:  [place] [address]    Phone number:  [phone] (home)     Additional phone number: N/a  Household Members/Support Persons (HM/SP):   Household Member/Support Person 1, Household Member/Support Person 2, Household Member/Support Person 3, Household Member/Support Person 4   HM/SP Name Relationship DOB or Age  HM/SP -[place] Father of baby Apr 19[birth year] 1999  HM/SP -2 Renee Crisanti Maternal grandmother of baby    HM/SP -[place] Maternal uncle of baby 56  HM/SP -[place] Maternal uncle of baby 64  HM/SP -5        HM/SP -6        HM/SP -7        HM/SP -8          Natural Supports (not living in the home):      Professional Supports: None   Employment: Self-employed   Type of Work:     Education:  [place] arranged:    Museum/gallery curator Resources:  Medicaid   Other Resources:  [employer]   Cultural/Religious Considerations Which May Impact Care:  N/a  Strengths:  Ability to meet basic needs     Psychotropic Medications:         Pediatrician:    Surveyor, minerals List:   [place]      Pediatrician Fax Number: [phone]  Risk Factors/Current Problems:  Substance Use     Cognitive State:      Mood/Affect:  Calm     CSW Assessment: CSW spoke with patient who admitted to smoking marijuana during her pregnancy. Patient stated she only smoked because she did not have an  appetite. Patient denied any other drug usage. The father of the baby, London Pepper was present in the room but was asleep in the chair during the assessment. CSW also spoke to patient about her edinburgh scale results. Patient stated she has history of depression but has never been on any medication. Patient denied any SI/HI/DV history but admitted in self harming behaviors. Patient stated she has history of cutting herself but isn't worried about that now because her daughter is born and is hopeful she will not have those thoughts anymore. Patient stated if she is feeling that way she will have her mother watch her daughter. Patient was also interested in therapy and was given outpatient mental health resources for [facility]. Patient stated she had all baby items and CSW went over PPD and SIDS. CPS report was filed   CSW Plan/Description:  No Further Intervention Required/No Barriers to Discharge    Raina Mina, [place] 12/05/2021, 11:47 AM

## 2021-12-05 NOTE — Progress Notes (Signed)
Progress Note - Vaginal Delivery  Hannah Mcgrath is a 21 y.o. G1P0101 now PP day 1 s/p Vaginal, Spontaneous .   Subjective:  The patient reports no complaints, up ad lib, voiding, tolerating PO, and + flatus   Objective:  Vital signs in last 24 hours: Temp:  [98.1 F (36.7 C)-98.9 F (37.2 C)] 98.1 F (36.7 C) (02/18 2328) Pulse Rate:  [58-77] 64 (02/18 2328) Resp:  [14-20] 20 (02/18 2328) BP: (111-132)/(57-72) 131/60 (02/18 2328) SpO2:  [98 %-100 %] 99 % (02/18 2328)  Physical Exam:  General: alert, cooperative, appears stated age, and no distress Lochia: appropriate Uterine Fundus: firm @ U-1    Data Review Recent Labs    12/03/21 2356  HGB 11.2*  HCT 32.9*    Assessment/Plan: Principal Problem:   Leakage of amniotic fluid Active Problems:   Premature rupture of membranes   Plan for discharge tomorrow   -- Continue routine PP care.     Philip Aspen, CNM  12/05/2021 7:06 AM

## 2021-12-05 NOTE — Anesthesia Postprocedure Evaluation (Signed)
Anesthesia Post Note  Patient: Hannah Mcgrath  Procedure(s) Performed: AN AD HOC LABOR EPIDURAL  Patient location during evaluation: Mother Baby Anesthesia Type: Epidural Level of consciousness: awake and alert Pain management: pain level controlled Vital Signs Assessment: post-procedure vital signs reviewed and stable Respiratory status: spontaneous breathing, nonlabored ventilation and respiratory function stable Cardiovascular status: stable Postop Assessment: no headache, no backache and epidural receding Anesthetic complications: no   No notable events documented.   Last Vitals:  Vitals:   12/05/21 0747 12/05/21 1245  BP: 115/68   Pulse: (!) 53   Resp: 20   Temp: 36.6 C 36.7 C  SpO2: 100%     Last Pain:  Vitals:   12/05/21 1245  TempSrc: Axillary  PainSc:                  [name]

## 2021-12-06 DIAGNOSIS — Z3A35 35 weeks gestation of pregnancy: Secondary | ICD-10-CM

## 2021-12-06 MED ORDER — SERTRALINE HCL 50 MG PO TABS: 25.0000 mg | ORAL_TABLET | Freq: Every day | ORAL | 8 refills | Status: AC

## 2021-12-06 MED ORDER — NORETHINDRONE 0.35 MG PO TABS: 1.0000 | ORAL_TABLET | Freq: Every day | ORAL | 11 refills | Status: AC

## 2021-12-06 NOTE — Discharge Summary (Signed)
OB Discharge Summary     Patient Name: Hannah Mcgrath DOB: 2001/09/02 MRN: [id number]  Date of admission: 12/03/2021 Delivering provider: Doreene Burke, CNM Date of Delivery: 12/04/2021  Date of discharge: 12/06/2021  Admitting diagnosis: Leakage of amniotic fluid [O42.90] Premature rupture of membranes [O42.90] Intrauterine pregnancy: [redacted]w[redacted]d     Secondary diagnosis: None     Discharge diagnosis: Preterm Pregnancy Delivered                                                                                                Post partum procedures:rhogam  Augmentation: N/A  Complications: None  Hospital course:  Onset of Labor With Vaginal Delivery      21 y.o. yo G1P0101 at [redacted]w[redacted]d was admitted in Active Labor on 12/03/2021. Patient had an uncomplicated labor course as follows:  Membrane Rupture Time/Date: 6:00 PM ,12/03/2021   Delivery Method:Vaginal, Spontaneous  Episiotomy: None  Lacerations:  None  See delivery note for details  Patient had an uncomplicated postpartum course.  She is tolerating regular diet, her pain is controlled with PO medication, she is ambulating and voiding without difficulty. She reports breastfeeding is going well. She has a history of anxiety and depression although has never been medicated for the same. We discussed moods as it relates to the postpartum period as well as the importance of sleep and support. She is agreeable to starting a low dose of zoloft.   Patient is discharged home in stable condition on 12/06/21.  Newborn Data: Birth date:12/04/2021  Birth time:4:42 AM  Gender:Female  [place] Living status:Living  Apgars:8 ,9  Weight:2410 g   Physical exam  Vitals:   12/05/21 1645 12/05/21 1920 12/05/21 2300 12/06/21 0754  BP: 107/88 126/64 129/63 120/60  Pulse: 63 64 68 60  Resp: 18 20 20 18   Temp: 98.2 F (36.8 C) 98.3 F (36.8 C) 98.3 F (36.8 C) 98.2 F (36.8 C)  TempSrc: Oral Oral Oral Oral  SpO2: 99% 100% 100% 100%  Weight:       Height:       General: alert, cooperative, and no distress Lochia: appropriate Uterine Fundus: firm Incision: N/A DVT Evaluation: No evidence of DVT seen on physical exam.  Labs: Lab Results  Component Value Date   WBC 16.3 (H) 12/05/2021   HGB 9.6 (L) 12/05/2021   HCT 28.5 (L) 12/05/2021   MCV 93.4 12/05/2021   PLT 199 12/05/2021    Discharge instruction: per After Visit Summary.  Medications:  Allergies as of 12/06/2021   No Known Allergies      Medication List     STOP taking these medications    metroNIDAZOLE 500 MG tablet Commonly known as: FLAGYL   ondansetron 4 MG disintegrating tablet Commonly known as: Zofran ODT       TAKE these medications    norethindrone 0.35 MG tablet Commonly known as: MICRONOR Take 1 tablet (0.35 mg total) by mouth daily. Start taking on: January 02, 2022   sertraline 50 MG tablet Commonly known as: Zoloft Take 0.5 tablets (25 mg total) by mouth daily.   Vitafol FE+ 90-0.6-0.4-200 MG Caps  Take 1 tablet by mouth daily.        Diet: routine diet  Activity: Advance as tolerated. Pelvic rest for 6 weeks.   Outpatient follow up:  Follow-up Information     [facility]. Go in 2 week(s).   Specialty: Obstetrics and Gynecology Why: postpartum mood check and schedule appointment for 4-6 weeks postpartum Contact information: [address] [place] [address] [phone]                  Postpartum contraception: Progesterone only pills Rhogam Given postpartum: yes Rubella vaccine given postpartum: immune Varicella vaccine given postpartum: immune TDaP given antepartum or postpartum: given antepartum   Newborn Delivery   Birth date/time: 12/04/2021 04:42:00 Delivery type: Vaginal, Spontaneous       Baby Feeding: Breast  Disposition:home with mother  SIGNED:  Tresea Mall, CNM 12/06/2021 9:53 AM

## 2021-12-06 NOTE — Progress Notes (Signed)
D/C order from MD.  Reviewed d/c instructions and prescriptions with patient and answered any questions.  Patient d/c home with infant via wheelchair by nursing/auxillary. 

## 2021-12-06 NOTE — TOC Initial Note (Signed)
Transition of Care [facility]) - Initial/Assessment Note    Patient Details  Name: Hannah Mcgrath MRN: [id number] Date of Birth: [birth year]  Transition [facility]) CM/SW Contact:    [place] Cellar, RN Phone Number: 12/06/2021, 9:14 AM  Clinical Narrative:                 Received call from [facility] reports they will be coming to assess patient and infant today @ 1pm.         Patient Goals and CMS Choice        Expected Discharge Plan and Services                                                Prior Living Arrangements/Services                       Activities of Daily Living Home Assistive Devices/Equipment: None ADL Screening (condition at time of admission) Patient's cognitive ability adequate to safely complete daily activities?: Yes Is the patient deaf or have difficulty hearing?: No Does the patient have difficulty seeing, even when wearing glasses/contacts?: No Does the patient have difficulty concentrating, remembering, or making decisions?: No Patient able to express need for assistance with ADLs?: Yes Does the patient have difficulty dressing or bathing?: No Independently performs ADLs?: Yes (appropriate for developmental age) Does the patient have difficulty walking or climbing stairs?: No Weakness of Legs: None Weakness of Arms/Hands: None  Permission Sought/Granted                  Emotional Assessment              Admission diagnosis:  Leakage of amniotic fluid [O42.90] Premature rupture of membranes [O42.90] Patient Active Problem List   Diagnosis Date Noted   Leakage of amniotic fluid 12/03/2021   Premature rupture of membranes 12/03/2021   Anxiety 10/27/2021   Supervision of normal pregnancy 06/01/2021   Dysmenorrhea 11/15/2017   PCP:  [facility] Pharmacy:   [phone] - Cl[facility] - [place], [place] - [address] [place] [address] Phone: [phone] Fax:  [phone]  [id number] [place], [place] - [address] [place] [address] Phone: [phone] Fax: [phone]     Social Determinants of Health (SDOH) Interventions    Readmission Risk Interventions No flowsheet data found.

## 2021-12-06 NOTE — Lactation Note (Signed)
This note was copied from a baby's chart. Lactation Consultation Note  Patient Name: Hannah Mcgrath [id number] Date: 12/06/2021 Reason for consult: Initial assessment;Primapara;Late-preterm 34-36.6wks;Infant < 6lbs Age:23 hours  Lactation initial visit prior to anticipated discharge today. Baby was born late preterm at [redacted]w[redacted]d vaginally 56hrs ago.   Maternal Data Has patient been taught Hand Expression?: Yes Does the patient have breastfeeding experience prior to this delivery?: No  Feeding Mother's Current Feeding Choice: Breast Milk  Mom has only been breastfeeding thus far since delivery. The late preterm protocol was not initiated following delivery, mom has not been pumping following feeds or offering supplement. Baby has weight loss of 8% as of midnight. LC spoke with RN taking care of dyad today and received report- Pediatrician in passing also mentioned the possible need for supplementation.  LATCH Score                    Lactation Tools Discussed/Used Tools: Pump Breast pump type: Manual (getting ready for discharge) Pump Education: Setup, frequency, and cleaning;Milk Storage Reason for Pumping: late pre-term (protocol not initiated following delivery) Pumping frequency: q 3 hrs  Mom was planning discharge today, and has [facility] services. We discussed available pumps for rentals, reviewed cost, availability of pumps through the [facility] program. LC did give mom a hand pump, provided education on set-up, use, cleaning, milk storage/use as supplement per infants size and preterm protocol. Mom verbalized understanding; extra storage containers provided as well. LC provided guidance on how to do hands on pumping/pump and massage to ensure emptying from breast that infant does not feed from. Reviewed paced-bottle feeding for supplement of EBM or 22cal formula to prevent over eating or confusion between bottle and mom.  Interventions Interventions: Breast feeding basics  reviewed;Hand express;Pre-pump if needed;Hand pump;DEBP;[facility], late pre term)  Discharge Discharge Education: Engorgement and breast care;Outpatient recommendation Pump: Manual (given before discharge)  Mom given information for breastfeeding support post discharge through the community and outpatient services here.  Consult Status Consult Status: Complete    Danford Bad 12/06/2021, 12:48 PM

## 2021-12-07 LAB — RHOGAM INJECTION: Unit division: 0

## 2021-12-09 ENCOUNTER — Encounter: Payer: Medicaid Other | Admitting: Licensed Practical Nurse

## 2021-12-13 LAB — T.PALLIDUM AB, TOTAL: T Pallidum Abs: NONREACTIVE

## 2021-12-20 ENCOUNTER — Encounter: Payer: Self-pay | Admitting: Advanced Practice Midwife

## 2021-12-20 NOTE — Telephone Encounter (Signed)
Pt is scheduled with CRS on 3/9.   ?

## 2021-12-23 ENCOUNTER — Ambulatory Visit (INDEPENDENT_AMBULATORY_CARE_PROVIDER_SITE_OTHER): Payer: Medicaid Other | Admitting: Obstetrics and Gynecology

## 2021-12-23 ENCOUNTER — Other Ambulatory Visit: Payer: Self-pay

## 2021-12-23 ENCOUNTER — Encounter: Payer: Self-pay | Admitting: Obstetrics and Gynecology

## 2021-12-23 VITALS — BP 120/80 | Ht 65.0 in | Wt 179.0 lb

## 2021-12-23 DIAGNOSIS — N939 Abnormal uterine and vaginal bleeding, unspecified: Secondary | ICD-10-CM

## 2021-12-23 NOTE — Progress Notes (Signed)
? ?Postpartum Visit  ?Chief Complaint:  ?Chief Complaint  ?Patient presents with  ? Postpartum Care  ?  Had Heavy bleeding, no bleeding now  ? ? ?History of Present Illness: Patient is a 21 y.o. G1P0101 presents for postpartum visit. ? ?Date of delivery: 12/04/2021 ?Type of delivery: vaginal ?Lacerations: no repair, periuretrhal and perineal  ? ?Breast Feeding:  yes ?Lochia: reports passage of large clots on Saturday of last week. Previously bleeding was light.  ?Edinburgh Post-Partum Depression Score: 6  ?Date of last PAP: 2022 LSIL  ? ?She reports she has an episode of passage of 2 large clots on Saturday of last week. Bleeding has otherwise been light. ? ?Newborn Details:  ?SINGLETON :  ?1. Infant Status: Infant doing well at home with mother. ? ? ?Review of Systems: Review of Systems  ?Constitutional:  Negative for chills, fever, malaise/fatigue and weight loss.  ?HENT:  Negative for congestion, hearing loss and sinus pain.   ?Eyes:  Negative for blurred vision and double vision.  ?Respiratory:  Negative for cough, sputum production, shortness of breath and wheezing.   ?Cardiovascular:  Negative for chest pain, palpitations, orthopnea and leg swelling.  ?Gastrointestinal:  Negative for abdominal pain, constipation, diarrhea, nausea and vomiting.  ?Genitourinary:  Negative for dysuria, flank pain, frequency, hematuria and urgency.  ?Musculoskeletal:  Negative for back pain, falls and joint pain.  ?Skin:  Negative for itching and rash.  ?Neurological:  Negative for dizziness and headaches.  ?Psychiatric/Behavioral:  Negative for depression, substance abuse and suicidal ideas. The patient is not nervous/anxious.   ? ?Past Medical History:  ?Past Medical History:  ?Diagnosis Date  ? Dysmenorrhea   ? Human papilloma virus (HPV) type 9 vaccine administered   ? gardisil series complete  ? Ovarian cyst 2017  ? RT  ? ? ?Past Surgical History:  ?Past Surgical History:  ?Procedure Laterality Date  ? NO PAST SURGERIES    ?  WISDOM TOOTH EXTRACTION Bilateral   ? ? ?Family History:  ?Family History  ?Problem Relation Age of Onset  ? Non-Hodgkin's lymphoma Father 88  ? ? ?Social History:  ?Social History  ? ?Socioeconomic History  ? Marital status: Single  ?  Spouse name: Not on file  ? Number of children: Not on file  ? Years of education: Not on file  ? Highest education level: Not on file  ?Occupational History  ? Not on file  ?Tobacco Use  ? Smoking status: Never  ? Smokeless tobacco: Never  ?Vaping Use  ? Vaping Use: Never used  ?Substance and Sexual Activity  ? Alcohol use: No  ? Drug use: Not Currently  ?  Types: Marijuana  ?  Comment: occ  ? Sexual activity: Yes  ?  Birth control/protection: Pill  ?Other Topics Concern  ? Not on file  ?Social History Narrative  ? Not on file  ? ?Social Determinants of Health  ? ?Financial Resource Strain: Not on file  ?Food Insecurity: Not on file  ?Transportation Needs: Not on file  ?Physical Activity: Not on file  ?Stress: Not on file  ?Social Connections: Not on file  ?Intimate Partner Violence: Not on file  ? ? ?Allergies:  ?No Known Allergies ? ?Medications: ?Prior to Admission medications   ?Medication Sig Start Date End Date Taking? Authorizing Provider  ?Prenat-Fe Poly-Methfol-FA-DHA (VITAFOL FE+) 90-0.6-0.4-200 MG CAPS Take 1 tablet by mouth daily. 07/20/21  Yes Rod Can, CNM  ?sertraline (ZOLOFT) 50 MG tablet Take 0.5 tablets (25 mg total) by mouth  daily. 12/06/21  Yes Rod Can, CNM  ?norethindrone (MICRONOR) 0.35 MG tablet Take 1 tablet (0.35 mg total) by mouth daily. ?Patient not taking: Reported on 12/23/2021 01/02/22   Rod Can, CNM  ? ? ?Physical Exam ?Vitals:  ?Vitals:  ? 12/23/21 1024  ?BP: 120/80  ? ? ?Physical Exam ?Constitutional:   ?   Appearance: Normal appearance. She is well-developed.  ?HENT:  ?   Head: Normocephalic and atraumatic.  ?Eyes:  ?   Extraocular Movements: Extraocular movements intact.  ?   Pupils: Pupils are equal, round, and reactive to light.   ?Neck:  ?   Thyroid: No thyromegaly.  ?Cardiovascular:  ?   Rate and Rhythm: Normal rate and regular rhythm.  ?   Heart sounds: Normal heart sounds.  ?Pulmonary:  ?   Effort: Pulmonary effort is normal.  ?   Breath sounds: Normal breath sounds.  ?Abdominal:  ?   General: Bowel sounds are normal. There is no distension.  ?   Palpations: Abdomen is soft. There is no mass.  ?Musculoskeletal:  ?   Cervical back: Neck supple.  ?Neurological:  ?   Mental Status: She is alert and oriented to person, place, and time.  ?Skin: ?   General: Skin is warm and dry.  ?Psychiatric:     ?   Behavior: Behavior normal.     ?   Thought Content: Thought content normal.     ?   Judgment: Judgment normal.  ?Vitals reviewed.  ? ? ?Assessment: 21 y.o. G1P0101 presenting for 6 week postpartum visit ? ?Plan: ?Problem List Items Addressed This Visit   ?None ?Visit Diagnoses   ? ? Abnormal uterine bleeding    -  Primary  ? Relevant Orders  ? US PELVIS TRANSVAGINAL NON-OB (TV ONLY)  ? Postpartum care and examination      ? ?  ? ? ?1) Contraception-  POP prescribed at hospital ? ?2)  Pap: 2022 LSIL - 6 months ago, plan 12 month follow up pap in 6 months ? ?3) Patient underwent screening for postpartum depression with no concerns noted.- Continue SSRI. ? ?4) Reports passage of large clots about 5 days ago, since then bleeding has been light. TVUS ordered to access for retained products ? ?- Follow up in 4 weeks.  ? ?Adrian Prows MD, [place] ?[place], [place] ?12/23/2021 ?10:38 AM ? ?

## 2022-01-13 ENCOUNTER — Ambulatory Visit (INDEPENDENT_AMBULATORY_CARE_PROVIDER_SITE_OTHER): Payer: Medicaid Other

## 2022-01-13 ENCOUNTER — Other Ambulatory Visit: Payer: Self-pay | Admitting: Obstetrics and Gynecology

## 2022-01-13 DIAGNOSIS — N939 Abnormal uterine and vaginal bleeding, unspecified: Secondary | ICD-10-CM

## 2022-01-20 ENCOUNTER — Ambulatory Visit (INDEPENDENT_AMBULATORY_CARE_PROVIDER_SITE_OTHER): Payer: Medicaid Other | Admitting: Obstetrics and Gynecology

## 2022-01-20 ENCOUNTER — Encounter: Payer: Self-pay | Admitting: Obstetrics and Gynecology

## 2022-01-20 DIAGNOSIS — N771 Vaginitis, vulvitis and vulvovaginitis in diseases classified elsewhere: Secondary | ICD-10-CM

## 2022-01-20 DIAGNOSIS — Z113 Encounter for screening for infections with a predominantly sexual mode of transmission: Secondary | ICD-10-CM

## 2022-01-20 DIAGNOSIS — R875 Abnormal microbiological findings in specimens from female genital organs: Secondary | ICD-10-CM

## 2022-01-20 NOTE — Progress Notes (Signed)
? ?Postpartum Visit  ?Chief Complaint:  ?Chief Complaint  ?Patient presents with  ? Postpartum Care  ?  Would like STD testing  ? ? ?History of Present Illness: Patient is a 21 y.o. G1P0101 presents for postpartum visit. ? ?Date of delivery: 12/04/2021 ?Type of delivery: vaginal ?Laceration: minimal  ? ?Breast Feeding:  yes ?Lochia: normal  ?Edinburgh Post-Partum Depression Score: 1  ?Date of last PAP: 05/2021 LSIL ? ?She reports she has been doing well. No concerns. Taking a POP. ? ?Newborn Details:  ?SINGLETON :  ?1. Infant Status: Infant doing well at home with mother. ? ? ?Review of Systems: Review of Systems  ?Constitutional:  Negative for chills, fever, malaise/fatigue and weight loss.  ?HENT:  Negative for congestion, hearing loss and sinus pain.   ?Eyes:  Negative for blurred vision and double vision.  ?Respiratory:  Negative for cough, sputum production, shortness of breath and wheezing.   ?Cardiovascular:  Negative for chest pain, palpitations, orthopnea and leg swelling.  ?Gastrointestinal:  Negative for abdominal pain, constipation, diarrhea, nausea and vomiting.  ?Genitourinary:  Negative for dysuria, flank pain, frequency, hematuria and urgency.  ?Musculoskeletal:  Negative for back pain, falls and joint pain.  ?Skin:  Negative for itching and rash.  ?Neurological:  Negative for dizziness and headaches.  ?Psychiatric/Behavioral:  Negative for depression, substance abuse and suicidal ideas. The patient is not nervous/anxious.   ? ?Past Medical History:  ?Past Medical History:  ?Diagnosis Date  ? Dysmenorrhea   ? Human papilloma virus (HPV) type 9 vaccine administered   ? gardisil series complete  ? Ovarian cyst 2017  ? RT  ? ? ?Past Surgical History:  ?Past Surgical History:  ?Procedure Laterality Date  ? WISDOM TOOTH EXTRACTION Bilateral   ? ? ?Family History:  ?Family History  ?Problem Relation Age of Onset  ? Non-Hodgkin's lymphoma Father 60  ? ? ?Social History:  ?Social History  ? ?Socioeconomic  History  ? Marital status: Single  ?  Spouse name: Not on file  ? Number of children: Not on file  ? Years of education: Not on file  ? Highest education level: Not on file  ?Occupational History  ? Not on file  ?Tobacco Use  ? Smoking status: Never  ? Smokeless tobacco: Never  ?Vaping Use  ? Vaping Use: Never used  ?Substance and Sexual Activity  ? Alcohol use: No  ? Drug use: Not Currently  ?  Types: Marijuana  ?  Comment: occ  ? Sexual activity: Yes  ?  Birth control/protection: Pill  ?Other Topics Concern  ? Not on file  ?Social History Narrative  ? Not on file  ? ?Social Determinants of Health  ? ?Financial Resource Strain: Not on file  ?Food Insecurity: Not on file  ?Transportation Needs: Not on file  ?Physical Activity: Not on file  ?Stress: Not on file  ?Social Connections: Not on file  ?Intimate Partner Violence: Not on file  ? ? ?Allergies:  ?No Known Allergies ? ?Medications: ?Prior to Admission medications   ?Medication Sig Start Date End Date Taking? Authorizing Provider  ?norethindrone (MICRONOR) 0.35 MG tablet Take 1 tablet (0.35 mg total) by mouth daily. 01/02/22  Yes Tresea Mall, CNM  ?sertraline (ZOLOFT) 50 MG tablet Take 0.5 tablets (25 mg total) by mouth daily. 12/06/21  Yes Tresea Mall, CNM  ? ? ?Physical Exam ?Vitals:  ?Vitals:  ? 01/20/22 1319  ?BP: 100/60  ? ? ?Physical Exam ?Constitutional:   ?   Appearance: She is  well-developed.  ?Genitourinary:  ?   Genitourinary Comments: External: Normal appearing vulva. No lesions noted. Well healed lacerations ?  ?HENT:  ?   Head: Normocephalic and atraumatic.  ?Neck:  ?   Thyroid: No thyromegaly.  ?Cardiovascular:  ?   Rate and Rhythm: Normal rate and regular rhythm.  ?   Heart sounds: Normal heart sounds.  ?Pulmonary:  ?   Effort: Pulmonary effort is normal.  ?   Breath sounds: Normal breath sounds.  ?Abdominal:  ?   General: Bowel sounds are normal. There is no distension.  ?   Palpations: Abdomen is soft. There is no mass.  ?Musculoskeletal:   ?   Cervical back: Neck supple.  ?Neurological:  ?   Mental Status: She is alert and oriented to person, place, and time.  ?Skin: ?   General: Skin is warm and dry.  ?Psychiatric:     ?   Behavior: Behavior normal.     ?   Thought Content: Thought content normal.     ?   Judgment: Judgment normal.  ?Vitals reviewed.  ? ? ?Assessment: 21 y.o. G1P0101 presenting for 6 week postpartum visit ? ?Plan: ?Problem List Items Addressed This Visit   ?None ?Visit Diagnoses   ? ? Postpartum care and examination    -  Primary  ? ?  ? ? ?1) Contraception-  POP ? ?2)  Pap: up to date- discussed repeating in August of this year. ? ?3) Patient underwent screening for postpartum depression with no concerns noted. ? ?4) Swab for STD check at patient request. ? ?- Follow up in 4 months for a pap smear  ? ?Adelene Idler MD, FACOG ?[facility], [facility] ?01/20/2022 ?1:30 PM ? ?

## 2022-01-22 LAB — NUSWAB VAGINITIS PLUS (VG+)
BVAB 2: HIGH Score — AB
Candida albicans, NAA: NEGATIVE
Candida glabrata, NAA: NEGATIVE
Chlamydia trachomatis, NAA: NEGATIVE
Megasphaera 1: HIGH Score — AB
Neisseria gonorrhoeae, NAA: NEGATIVE
Trich vag by NAA: NEGATIVE

## 2022-01-25 ENCOUNTER — Other Ambulatory Visit: Payer: Self-pay | Admitting: Obstetrics and Gynecology

## 2022-01-25 DIAGNOSIS — B9689 Other specified bacterial agents as the cause of diseases classified elsewhere: Secondary | ICD-10-CM

## 2022-01-25 MED ORDER — METRONIDAZOLE 500 MG PO TABS: 500.0000 mg | ORAL_TABLET | Freq: Two times a day (BID) | ORAL | 0 refills | Status: DC

## 2023-03-03 ENCOUNTER — Telehealth: Payer: Self-pay

## 2023-03-03 NOTE — Telephone Encounter (Signed)
Left message for patient to call office back to schedule annual appt 

## 2023-05-09 NOTE — Telephone Encounter (Signed)
As of 05/03/2023 pt has not called to schedule the annual exam.

## 2024-08-06 ENCOUNTER — Ambulatory Visit
Admission: EM | Admit: 2024-08-06 | Discharge: 2024-08-06 | Disposition: A | Discharge status: Home / Self Care | Attending: Emergency Medicine | Admitting: Emergency Medicine

## 2024-08-06 DIAGNOSIS — B084 Enteroviral vesicular stomatitis with exanthem: Secondary | ICD-10-CM | POA: Diagnosis not present

## 2024-08-06 LAB — GROUP A STREP BY PCR: Group A Strep by PCR: NOT DETECTED

## 2024-08-06 MED ORDER — IBUPROFEN 600 MG PO TABS: 600.0000 mg | ORAL_TABLET | Freq: Four times a day (QID) | ORAL | 0 refills | Status: AC | PRN

## 2024-08-06 NOTE — Discharge Instructions (Addendum)
 Your strep PCR testing was negative today.  1 gram of Tylenol  and 600 mg ibuprofen  together 3-4 times a day as needed for pain.  Make sure you drink plenty of extra fluids.  If your throat or mouth starts to hurt , some people find salt water gargles and  Traditional Medicinal's Throat Coat tea helpful. Take 5 mL of liquid Benadryl  and 5 mL of Maalox/Mylanta. Mix it together, and then hold it in your mouth for as long as you can and then swallow. You may do this 4 times a day.  Honey and lemon dissolved in hot water can also be soothing.  Here is a list of primary care providers who are taking new patients:  [facility] [place] Dr. Selinda Ku (sports medicine) Dr. Harlene Duos Rolan Hoyle, PA Dr. Mackey Ny Dr. Harlene Saddler [address] [place] [address] [phone]  [facility] [address] [place], [place] [address] [phone]  [facility] [place] [address] [place] [address]  [phone]  [facility] [address] [place], [place] [address] [phone]  [facility] [address] [place]  [phone] [place], [place] [address]  Here are clinics/ other resources who will see you if you do not have insurance. Some have certain criteria that you must meet. Call them and find out what they are:  [facility]: [address] [place], [place] [address] Phone: [phone] Hours: First and Third Saturdays of each Month, 9 a.m. - 1 p.m.  [facility]: [address] [place], [place] [address] Phone: [phone] Hours: Tuesday, 4 p.m. - 8 p.m. Thursday, 1 p.m. - 8 p.m. Wednesday, 9 a.m. - [facility] [address], [place], [place] [address] Phone: [phone] Pharmacy Phone Number: [phone] Dental Phone Number: [phone] [facility] Insurance Help: [phone]  Dental Hours: Monday - Thursday, 8 a.m. - 6 p.m.  [facility] [address] [place], [place] [address] Phone: [phone] Pharmacy Phone Number: [phone] [facility] Insurance Help: [phone]  [facility] [address] [place] [place], [place] [address] Phone: [phone] Pharmacy Phone Number: [phone] [facility] Insurance Help: [phone]  [facility] [address] [place], [place] [address] Phone: [phone] [facility] Insurance Help: [phone]   [facility] [address], [place], [place] [address] Phone: [phone]  Go to www.goodrx.com  or www.costplusdrugs.com to look up your medications. This will give you a list of where you can find your prescriptions at the most affordable prices. Or ask the pharmacist what the cash price is, or if they have any other discount programs available to help make your medication more affordable. This can be less expensive than what you would pay with insurance.

## 2024-08-06 NOTE — ED Provider Notes (Signed)
 HPI  SUBJECTIVE:  Hannah Mcgrath is a 23 y.o. female who presents with slightly painful erythematous bumps on her hands, feet and in her mouth/throat noticed earlier today.  She has had recent nasal congestion and rhinorrhea and a dry, scratchy throat.  No fevers, cough, sore throat.  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  Her daughter also had had foot mouth disease 2 weeks ago.  No aggravating or alleviating factors.  She has not tried anything for symptoms.  She has no past medical history.  LMP: The end of September.  Denies the possibility of being pregnant.  PCP: None.    Past Medical History:  Diagnosis Date   Dysmenorrhea    Human papilloma virus (HPV) type 9 vaccine administered    gardisil series complete   Ovarian cyst 2017   RT    Past Surgical History:  Procedure Laterality Date   WISDOM TOOTH EXTRACTION Bilateral     Family History  Problem Relation Age of Onset   Non-Hodgkin's lymphoma Father 61    Social History   Tobacco Use   Smoking status: Never   Smokeless tobacco: Never  Vaping Use   Vaping status: Never Used  Substance Use Topics   Alcohol use: No   Drug use: Not Currently    Types: Marijuana    Comment: occ    No current facility-administered medications for this encounter.  Current Outpatient Medications:    ibuprofen  (ADVIL ) 600 MG tablet, Take 1 tablet (600 mg total) by mouth every 6 (six) hours as needed., Disp: 30 tablet, Rfl: 0   norethindrone  (MICRONOR ) 0.35 MG tablet, Take 1 tablet (0.35 mg total) by mouth daily., Disp: 30 tablet, Rfl: 11   sertraline  (ZOLOFT ) 50 MG tablet, Take 0.5 tablets (25 mg total) by mouth daily., Disp: 30 tablet, Rfl: 8  No Known Allergies   ROS  As noted in HPI.   Physical Exam  BP (!) 126/43 (BP Location: Left Arm)   Pulse 66   Temp 98.5 F (36.9 C) (Oral)   Resp 16   Ht 5' 5 (1.651 m)   Wt 77.1 kg   LMP 07/15/2024 (Approximate)   SpO2 98%    Breastfeeding No   BMI 28.29 kg/m   Constitutional: Well developed, well nourished, no acute distress Eyes:  EOMI, conjunctiva normal bilaterally HENT: Normocephalic, atraumatic,mucus membranes moist.  No intraoral ulcers.  Normal oropharynx.  Normal buccal mucosa. Neck: No cervical lymphadenopathy Respiratory: Normal inspiratory effort Cardiovascular: Normal rate GI: nondistended skin: Tender papular and vesicular rash on erythematous base on hands, feet.           Musculoskeletal: no deformities Neurologic: Alert & oriented x 3, no focal neuro deficits Psychiatric: Speech and behavior appropriate   ED Course   Medications - No data to display  Orders Placed This Encounter  Procedures   Group A Strep by PCR    Standing Status:   Standing    Number of Occurrences:   1    Results for orders placed or performed during the hospital encounter of 08/06/24 (from the past 24 hours)  Group A Strep by PCR     Status: None   Collection Time: 08/06/24 12:09 PM   Specimen: Throat; Sterile Swab  Result Value Ref Range   Group A Strep by PCR NOT DETECTED NOT DETECTED   No results found.  ED Clinical Impression  1. Hand, foot and mouth disease  ED Assessment/Plan     Strep PCR negative  Presentation consistent with early hand-foot-and-mouth disease given that she works in a daycare and there has been multiple cases of that.  Home with supportive treatment.  Tylenol /ibuprofen , Benadryl /Maalox mixture as needed for oral pain.  Discussed that she is okay to go to work if blisters are intact, we discussed when to stay home.  Will give patient a primary care list for routine care  Discussed  MDM, treatment plan, and plan for follow-up with patient. . patient agrees with plan.   Meds ordered this encounter  Medications   ibuprofen  (ADVIL ) 600 MG tablet    Sig: Take 1 tablet (600 mg total) by mouth every 6 (six) hours as needed.    Dispense:  30 tablet    Refill:   0      *This clinic note was created using Scientist, clinical (histocompatibility and immunogenetics). Therefore, there may be occasional mistakes despite careful proofreading.  ?    Van Knee, MD 08/06/24 1343

## 2024-08-06 NOTE — ED Triage Notes (Signed)
 Pt c/o bumps in hands/feet & throat that started today. Exposed to HFM.
